# Patient Record
Sex: Male | Born: 1965
Health system: Southern US, Community
[De-identification: ages and names within clinical notes are randomized; demographics above are authoritative.]

## PROBLEM LIST (undated history)

## (undated) DIAGNOSIS — J309 Allergic rhinitis, unspecified: Secondary | ICD-10-CM

## (undated) DIAGNOSIS — E781 Pure hyperglyceridemia: Secondary | ICD-10-CM

## (undated) DIAGNOSIS — E785 Hyperlipidemia, unspecified: Secondary | ICD-10-CM

## (undated) DIAGNOSIS — K219 Gastro-esophageal reflux disease without esophagitis: Secondary | ICD-10-CM

## (undated) DIAGNOSIS — N289 Disorder of kidney and ureter, unspecified: Secondary | ICD-10-CM

## (undated) DIAGNOSIS — R740 Nonspecific elevation of levels of transaminase and lactic acid dehydrogenase [LDH]: Secondary | ICD-10-CM

## (undated) HISTORY — DX: Hyperlipidemia, unspecified: E78.5

## (undated) HISTORY — DX: Gastro-esophageal reflux disease without esophagitis: K21.9

## (undated) HISTORY — DX: Disorder of kidney and ureter, unspecified: N28.9

## (undated) HISTORY — DX: Nonspecific elevation of levels of transaminase and lactic acid dehydrogenase (ldh): R74.0

## (undated) HISTORY — DX: Pure hyperglyceridemia: E78.1

## (undated) HISTORY — DX: Allergic rhinitis, unspecified: J30.9

---

## 1997-11-19 ENCOUNTER — Other Ambulatory Visit: Admission: RE | Admit: 1997-11-19 | Discharge: 1997-11-19 | Payer: Self-pay | Admitting: Obstetrics and Gynecology

## 2007-12-05 ENCOUNTER — Ambulatory Visit: Payer: Self-pay | Admitting: Internal Medicine

## 2007-12-05 DIAGNOSIS — E785 Hyperlipidemia, unspecified: Secondary | ICD-10-CM | POA: Insufficient documentation

## 2007-12-05 DIAGNOSIS — K219 Gastro-esophageal reflux disease without esophagitis: Secondary | ICD-10-CM

## 2007-12-05 DIAGNOSIS — J309 Allergic rhinitis, unspecified: Secondary | ICD-10-CM

## 2007-12-05 HISTORY — DX: Gastro-esophageal reflux disease without esophagitis: K21.9

## 2007-12-05 HISTORY — DX: Hyperlipidemia, unspecified: E78.5

## 2007-12-05 HISTORY — DX: Allergic rhinitis, unspecified: J30.9

## 2010-03-05 ENCOUNTER — Ambulatory Visit: Payer: Self-pay | Admitting: Internal Medicine

## 2010-03-05 LAB — CONVERTED CEMR LAB
Albumin: 4.5 g/dL (ref 3.5–5.2)
Alkaline Phosphatase: 50 units/L (ref 39–117)
Bilirubin Urine: NEGATIVE
Calcium: 9.5 mg/dL (ref 8.4–10.5)
Cholesterol: 189 mg/dL (ref 0–200)
Eosinophils Absolute: 0.1 10*3/uL (ref 0.0–0.7)
Eosinophils Relative: 1.5 % (ref 0.0–5.0)
GFR calc non Af Amer: 96.9 mL/min (ref >60)
Glucose, Bld: 95 mg/dL (ref 70–99)
HCT: 43.1 % (ref 39.0–52.0)
HDL: 43.5 mg/dL (ref >39.00)
Hemoglobin, Urine: NEGATIVE
Lymphs Abs: 1.8 10*3/uL (ref 0.7–4.0)
MCHC: 35.2 g/dL (ref 30.0–36.0)
MCV: 90.9 fL (ref 78.0–100.0)
Monocytes Absolute: 0.4 10*3/uL (ref 0.1–1.0)
Platelets: 197 10*3/uL (ref 150.0–400.0)
RDW: 12.6 % (ref 11.5–14.6)
Sodium: 139 meq/L (ref 135–145)
Total Protein, Urine: NEGATIVE mg/dL
Urine Glucose: NEGATIVE mg/dL
VLDL: 127.4 mg/dL — ABNORMAL HIGH (ref 0.0–40.0)
WBC: 6.2 10*3/uL (ref 4.5–10.5)

## 2010-03-08 ENCOUNTER — Ambulatory Visit: Payer: Self-pay | Admitting: Internal Medicine

## 2010-03-08 ENCOUNTER — Encounter: Payer: Self-pay | Admitting: Internal Medicine

## 2010-03-08 DIAGNOSIS — R7401 Elevation of levels of liver transaminase levels: Secondary | ICD-10-CM

## 2010-03-08 DIAGNOSIS — R740 Nonspecific elevation of levels of transaminase and lactic acid dehydrogenase [LDH]: Secondary | ICD-10-CM

## 2010-03-08 DIAGNOSIS — R7402 Elevation of levels of lactic acid dehydrogenase (LDH): Secondary | ICD-10-CM | POA: Insufficient documentation

## 2010-03-08 HISTORY — DX: Elevation of levels of liver transaminase levels: R74.01

## 2010-03-12 ENCOUNTER — Encounter: Admission: RE | Admit: 2010-03-12 | Discharge: 2010-03-12 | Payer: Self-pay | Admitting: Internal Medicine

## 2010-03-12 ENCOUNTER — Encounter: Payer: Self-pay | Admitting: Internal Medicine

## 2010-03-12 DIAGNOSIS — N289 Disorder of kidney and ureter, unspecified: Secondary | ICD-10-CM

## 2010-03-12 HISTORY — DX: Disorder of kidney and ureter, unspecified: N28.9

## 2010-03-19 ENCOUNTER — Ambulatory Visit (HOSPITAL_COMMUNITY): Admission: RE | Admit: 2010-03-19 | Discharge: 2010-03-19 | Payer: Self-pay | Admitting: Internal Medicine

## 2010-03-23 ENCOUNTER — Telehealth (INDEPENDENT_AMBULATORY_CARE_PROVIDER_SITE_OTHER): Payer: Self-pay | Admitting: *Deleted

## 2010-05-07 ENCOUNTER — Ambulatory Visit: Payer: Self-pay | Admitting: Internal Medicine

## 2010-06-06 LAB — CONVERTED CEMR LAB
ALT: 65 units/L — ABNORMAL HIGH (ref 0–53)
AST: 46 units/L — ABNORMAL HIGH (ref 0–37)
Albumin: 4.2 g/dL (ref 3.5–5.2)
Alkaline Phosphatase: 45 units/L (ref 39–117)
BUN: 11 mg/dL (ref 6–23)
Basophils Relative: 0.3 % (ref 0.0–3.0)
CO2: 30 meq/L (ref 19–32)
Chloride: 103 meq/L (ref 96–112)
Cholesterol: 173 mg/dL (ref 0–200)
Creatinine, Ser: 0.9 mg/dL (ref 0.4–1.5)
Eosinophils Relative: 1.5 % (ref 0.0–5.0)
Glucose, Bld: 97 mg/dL (ref 70–99)
Hemoglobin, Urine: NEGATIVE
LDL Cholesterol: 86 mg/dL (ref 0–99)
Monocytes Relative: 6.5 % (ref 3.0–12.0)
Neutrophils Relative %: 64.6 % (ref 43.0–77.0)
Nitrite: NEGATIVE
Platelets: 217 10*3/uL (ref 150–400)
Potassium: 4.3 meq/L (ref 3.5–5.1)
RBC: 4.67 M/uL (ref 4.22–5.81)
Specific Gravity, Urine: >=1.03 (ref 1.000–1.03)
Total Protein, Urine: NEGATIVE mg/dL
Total Protein: 7 g/dL (ref 6.0–8.3)
Urobilinogen, UA: 0.2 (ref 0.0–1.0)
VLDL: 33 mg/dL (ref 0–40)
WBC: 7.1 10*3/uL (ref 4.5–10.5)

## 2010-06-08 NOTE — Progress Notes (Signed)
  Phone Note Other Incoming   Request: Send information Summary of Call: Records request from [facility] forwarded to [facility].

## 2010-06-08 NOTE — Assessment & Plan Note (Signed)
Summary: CPX/BCBS/#/CD   Vital Signs:  Patient profile:   45 year old male Height:      72.5 inches Weight:      185 pounds BMI:     24.84 O2 Sat:      98 % on Room air Temp:     98.2 degrees F oral Pulse rate:   96 / minute BP sitting:   102 / 70  (left arm) Cuff size:   regular  Vitals Entered By: Zella Ball Ewing CMA Duncan Dull) (March 08, 2010 8:56 AM)  O2 Flow:  Room air  CC: Adult Physical/RE   CC:  Adult Physical/RE.  History of Present Illness: here for wellness, had recent  life insurance labs without fasting with elev TG; Pt denies CP, worsening sob, doe, wheezing, orthopnea, pnd, worsening LE edema, palps, dizziness or syncope  Pt denies new neuro symptoms such as headache, facial or extremity weakness  No fever, wt loss, night sweats, loss of appetite or other constitutional symptoms Pt denies polydipsia, polyuria.  Overall good compliance with meds, trying to follow low chol diet, wt stable, little excercise however - but joining the gym today. Has some occasaional loose stools.  Pt states good ability with ADL's, low fall risk, home safety reviewed and adequate, no significant change in hearing or vision, trying to follow lower chol diet, and occasionally active only with regular excercise.   Denies worsening depressive symptoms, suicidal ideation, or panic.    Preventive Screening-Counseling & Management      Drug Use:  no.    Problems Prior to Update: 1)  Transaminases, Serum, Elevated  (ICD-790.4) 2)  Preventive Health Care  (ICD-V70.0) 3)  Allergic Rhinitis  (ICD-477.9) 4)  Family History Diabetes 1st Degree Relative  (ICD-V18.0) 5)  Family History Breast Cancer 1st Degree Relative <50  (ICD-V16.3) 6)  Hyperlipidemia  (ICD-272.4) 7)  Gerd  (ICD-530.81)  Medications Prior to Update: 1)  None  Current Medications (verified): 1)  Fenofibrate 160 Mg Tabs (Fenofibrate) .Marland Kitchen.. 1po Once Daily 2)  Niacin Cr 500 Mg Cr-Tabs (Niacin) .Marland Kitchen.. 1 By Mouth Once Daily  Allergies  (verified): No Known Drug Allergies  Past History:  Past Medical History: Last updated: 12/05/2007 GERD Hyperlipidemia - hypertriglyceridemia Allergic rhinitis  Past Surgical History: Last updated: 12/05/2007 Denies surgical history  Family History: Last updated: 03/08/2010 Family History Breast cancer - grandmother Family History Diabetes - grandmother Family History Hypertension - father Family History Heart Disease - father grandmother with vascular disease father with PVD, high cholesterol grandfather with throat cancer father with gout  Social History: Last updated: 03/08/2010 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  all otherwise negative per pt -    Physical Exam  General:  Well-developed,well-nourished,in no acute distress; alert,appropriate and cooperative throughout examination Head:   Normocephalic and atraumatic without obvious abnormalities. No apparent alopecia or balding. Eyes:  No corneal or conjunctival inflammation noted. EOMI. Perrla. Ears:  R ear normal and L ear normal.   Nose:  no external deformity and no nasal discharge.   Mouth:  no gingival abnormalities and pharynx pink and moist.   Neck:  supple and no masses.   Lungs:  normal respiratory effort and normal breath sounds.   Heart:  normal rate and regular rhythm.   Abdomen:  Bowel sounds positive,abdomen soft and non-tender without masses, organomegaly or hernias noted. Msk:  No deformity or scoliosis noted of thoracic or lumbar spine.   Extremities:  No clubbing, cyanosis, edema, or deformity noted with normal full range of motion of all joints.   Neurologic:  No cranial nerve deficits noted. Station and gait are normal. Plantar reflexes are down-going bilaterally. DTRs are symmetrical throughout. Sensory, motor and coordinative functions appear intact. Skin:  color normal and no rashes.   Psych:  not anxious appearing and not depressed appearing.     Impression & Recommendations:  Problem # 1:  Preventive Health Care (ICD-V70.0) Overall doing well, age appropriate education and counseling updated, referral for preventive services and immunizations addressed, dietary counseling and smoking status adressed , most recent labs reviewed, ecg reviewed or declined I have personally reviewed and have noted 1.The patient's medical and social history 2.Their use of alcohol, tobacco or illicit drugs 3.Their current medications and supplements 4. Functional ability including ADL's, fall risk, home safety risk, hearing & visual impairment  5.Diet and physical activities 6.Evidence for depression or mood disorders The patients weight, height, BMI  have been recorded in the chart I have made referrals, counseling and provided education to the patient based review of the above  Orders: EKG w/ Interpretation  (93000)  Problem # 2:  TRANSAMINASES, SERUM, ELEVATED (ICD-790.4)  ? fatty liver - for hepatitis profile next draw, and abd u/s  Orders: Radiology Referral (Radiology)  Problem # 3:  HYPERLIPIDEMIA (ICD-272.4)  with elev tg;  treat as above with fenofibrate, and niacin ER, f/u any worsening signs or symptoms ; f/u labs 2 mo  His updated medication list for this problem includes:    Fenofibrate 160 Mg Tabs (Fenofibrate) .Marland Kitchen... 1po once daily    Niacin Cr 500 Mg Cr-tabs (Niacin) .Marland Kitchen... 1 by mouth once daily  Complete Medication List: 1)  Fenofibrate 160 Mg Tabs (Fenofibrate) .Marland Kitchen.. 1po once daily 2)  Niacin Cr 500 Mg Cr-tabs (Niacin) .Marland Kitchen.. 1 by mouth once daily  Patient Instructions: 1)  start the fenofibrate 160 mg now = 1 per day 2)  in one month, add the niacin ER 500 mg - 1 per day 3)  in 2 month return for LAB only : 4)  Hepatic Panel prior to visit, ICD-9: v58.69 5)  Lipid Panel prior to visit, ICD-9: 272.0 6)  Acute hepatitis profile:  790.4 7)  You will be contacted about the referral(s) to: abdomen ultrasound 8)  You had the tetanus shot today 9)  Please schedule a follow-up appointment in 1 year or sooner if needed Prescriptions: NIACIN CR 500 MG CR-TABS (NIACIN) 1 by mouth once daily  #90 x 3   Entered and Authorized by:   Corwin Levins MD   Signed by:   Corwin Levins MD on 03/08/2010  Method used:   Print then Give to Patient   RxID:   [id number] FENOFIBRATE 160 MG TABS (FENOFIBRATE) 1po once daily  #90 x 3   Entered and Authorized by:   Corwin Levins MD   Signed by:   Corwin Levins MD on 03/08/2010   Method used:   Print then Give to Patient   RxID:   [id number]    Orders Added: 1)  EKG w/ Interpretation [93000] 2)  Radiology Referral [Radiology] 3)  Est. Patient 40-64 years [99396]  Appended Document: Immunization Entry      Immunizations Administered:  Tetanus Vaccine:    Vaccine Type: Tdap    Site: left deltoid    Mfr: GlaxoSmithKline     Dose: 0.5 ml    Route: IM    Given by: Zella Ball Ewing CMA (AAMA)    Exp. Date: 02/26/2012    Lot #: [id number]    VIS given: 03/26/08 version given March 08, 2010.

## 2010-06-08 NOTE — Miscellaneous (Signed)
Summary: Orders Update   Clinical Lists Changes  Problems: Added new problem of UNSPECIFIED DISORDER OF KIDNEY AND URETER (ICD-593.9) Orders: Added new Referral order of Radiology Referral (Radiology) - Signed 

## 2011-01-13 ENCOUNTER — Encounter: Payer: Self-pay | Admitting: Internal Medicine

## 2011-01-13 ENCOUNTER — Ambulatory Visit (INDEPENDENT_AMBULATORY_CARE_PROVIDER_SITE_OTHER): Payer: BC Managed Care – PPO | Admitting: Internal Medicine

## 2011-01-13 VITALS — BP 102/62 | HR 101 | Temp 98.4°F | Ht 74.5 in | Wt 177.5 lb

## 2011-01-13 DIAGNOSIS — R059 Cough, unspecified: Secondary | ICD-10-CM

## 2011-01-13 DIAGNOSIS — K219 Gastro-esophageal reflux disease without esophagitis: Secondary | ICD-10-CM

## 2011-01-13 DIAGNOSIS — Z0000 Encounter for general adult medical examination without abnormal findings: Secondary | ICD-10-CM | POA: Insufficient documentation

## 2011-01-13 DIAGNOSIS — R05 Cough: Secondary | ICD-10-CM

## 2011-01-13 DIAGNOSIS — J309 Allergic rhinitis, unspecified: Secondary | ICD-10-CM

## 2011-01-13 MED ORDER — METHYLPREDNISOLONE ACETATE 80 MG/ML IJ SUSP
120.0000 mg | Freq: Once | INTRAMUSCULAR | Status: AC
  Administered 2011-01-13: 120 mg via INTRAMUSCULAR

## 2011-01-13 MED ORDER — FEXOFENADINE HCL 180 MG PO TABS: 180.0000 mg | ORAL_TABLET | Freq: Every day | ORAL | Status: DC

## 2011-01-13 MED ORDER — PREDNISONE 10 MG PO TABS: ORAL_TABLET | ORAL | Status: DC

## 2011-01-13 MED ORDER — FLUTICASONE PROPIONATE 50 MCG/ACT NA SUSP: 2.0000 | Freq: Every day | NASAL | Status: DC

## 2011-01-13 NOTE — Progress Notes (Signed)
  Subjective:    Patient ID: Philip Zimmerman, male    DOB: 06-15-1965, 45 y.o.   MRN: [id number]  HPI  Here with mod to severe sudden onset  9 days ongoing nasal allergy symptoms with clear congestion, itch and sneeze, without fever, pain, ST, or wheezing/sob, but with marked cough, worse in the AM to trying to wake up, somewhat prod with clearish congestion, no blood. Pt denies chest pain, increased sob or doe, wheezing, orthopnea, PND, increased LE swelling, palpitations, dizziness or syncope, except for the above. Pt denies new neurological symptoms such as new headache, or facial or extremity weakness or numbness   Pt denies polydipsia, polyuria.  Denies worsening reflux, dysphagia, abd pain, n/v, bowel change or blood. Past Medical History  Diagnosis Date  . ALLERGIC RHINITIS 12/05/2007  . GERD 12/05/2007  . HYPERLIPIDEMIA 12/05/2007  . TRANSAMINASES, SERUM, ELEVATED 03/08/2010  . Unspecified disorder of kidney and ureter 03/12/2010   No past surgical history on file.  reports that he has been smoking.  He does not have any smokeless tobacco history on file. He reports that he does not drink alcohol or use illicit drugs. family history includes Cancer in his maternal grandfather and maternal grandmother; Diabetes in his maternal grandmother; Gout in his father; Heart disease in his father; Hyperlipidemia in his father; and Hypertension in his father. No Known Allergies No current outpatient prescriptions on file prior to visit.   Review of Systems Review of Systems  Constitutional: Negative for diaphoresis and unexpected weight change.  HENT: Negative for drooling and tinnitus.   Eyes: Negative for photophobia and visual disturbance.  Respiratory: Negative for choking and stridor.   Gastrointestinal: Negative for vomiting and blood in stool.  Genitourinary: Negative for hematuria and decreased urine volume.      Objective:   Physical Exam BP 102/62  Pulse 101  Temp(Src) 98.4 F (36.9  C) (Oral)  Ht 6' 2.5" (1.892 m)  Wt 177 lb 8 oz (80.513 kg)  BMI 22.48 kg/m2  SpO2 94% Physical Exam  VS noted, not ill appearing Constitutional: Pt appears well-developed and well-nourished.  HENT: Head: Normocephalic.  Right Ear: External ear normal.  Left Ear: External ear normal.  Bilat tm's mild erythema.  Sinus nontender.  Pharynx mild erythema Eyes: Conjunctivae and EOM are normal. Pupils are equal, round, and reactive to light.  Neck: Normal range of motion. Neck supple.  Cardiovascular: Normal rate and regular rhythm.   Pulmonary/Chest: Effort normal and breath sounds normal.  Abd:  Soft, NT, non-distended, + BS Neurological: Pt is alert. No cranial nerve deficit.  Skin: Skin is warm. No erythema.  Psychiatric: Pt behavior is normal. Thought content normal.         Assessment & Plan:

## 2011-01-13 NOTE — Assessment & Plan Note (Signed)
Mild to mod seasonal flare, for depomedrol IM, and allegra/flonase asd,  to f/u any worsening symptoms or concerns

## 2011-01-13 NOTE — Assessment & Plan Note (Signed)
Likely related to post nasal gtt, but as he is also smoker, will check cxr

## 2011-01-13 NOTE — Patient Instructions (Addendum)
You had the steroid shot today Take all new medications as prescribed Please go to XRAY in the Basement for the x-ray test Please call the phone number [phone] (the [employer] System) for results of testing in 2-3 days;  When calling, simply dial the number, and when prompted enter the MRN number above (the Medical Record Number) and the # key, then the message should start. Please stop smoking Please return in 3 mo with Lab testing done 3-5 days before

## 2011-01-13 NOTE — Assessment & Plan Note (Signed)
stable overall by hx and exam, most recent data reviewed with pt, and pt to continue medical treatment as before  Lab Results  Component Value Date   WBC 6.2 03/05/2010   HGB 15.2 03/05/2010   HCT 43.1 03/05/2010   PLT 197.0 03/05/2010   CHOL 189 03/05/2010   TRIG 637.0 Triglyceride is over 400; calculations on Lipids are invalid. mg/dL* 96/08/5407   HDL 81.19 03/05/2010   LDLDIRECT 72.6 03/05/2010   ALT 67* 03/05/2010   AST 53* 03/05/2010   NA 139 03/05/2010   K 4.5 03/05/2010   CL 101 03/05/2010   CREATININE 0.9 03/05/2010   BUN 14 03/05/2010   CO2 32 03/05/2010   TSH 0.98 03/05/2010   PSA 0.58 03/05/2010

## 2011-04-15 ENCOUNTER — Ambulatory Visit: Payer: BC Managed Care – PPO | Admitting: Internal Medicine

## 2011-04-15 DIAGNOSIS — Z0289 Encounter for other administrative examinations: Secondary | ICD-10-CM

## 2011-05-19 ENCOUNTER — Other Ambulatory Visit: Payer: Self-pay | Admitting: Internal Medicine

## 2011-05-19 ENCOUNTER — Telehealth: Payer: Self-pay

## 2011-05-19 ENCOUNTER — Other Ambulatory Visit (INDEPENDENT_AMBULATORY_CARE_PROVIDER_SITE_OTHER): Payer: BC Managed Care – PPO

## 2011-05-19 DIAGNOSIS — Z1289 Encounter for screening for malignant neoplasm of other sites: Secondary | ICD-10-CM

## 2011-05-19 DIAGNOSIS — Z0000 Encounter for general adult medical examination without abnormal findings: Secondary | ICD-10-CM

## 2011-05-19 LAB — URINALYSIS, ROUTINE W REFLEX MICROSCOPIC
Bilirubin Urine: NEGATIVE
Leukocytes, UA: NEGATIVE
Nitrite: NEGATIVE
Urobilinogen, UA: 0.2 (ref 0.0–1.0)
pH: 5.5 (ref 5.0–8.0)

## 2011-05-19 LAB — HEPATIC FUNCTION PANEL
ALT: 52 U/L (ref 0–53)
Albumin: 4.4 g/dL (ref 3.5–5.2)
Total Bilirubin: 0.4 mg/dL (ref 0.3–1.2)

## 2011-05-19 LAB — LIPID PANEL
Total CHOL/HDL Ratio: 5
VLDL: 235.8 mg/dL — ABNORMAL HIGH (ref 0.0–40.0)

## 2011-05-19 LAB — BASIC METABOLIC PANEL
BUN: 15 mg/dL (ref 6–23)
Chloride: 104 mEq/L (ref 96–112)
Creatinine, Ser: 1 mg/dL (ref 0.4–1.5)
GFR: 103.37 mL/min (ref >60.00)

## 2011-05-19 LAB — CBC WITH DIFFERENTIAL/PLATELET
Basophils Relative: 0.2 % (ref 0.0–3.0)
Eosinophils Absolute: 0.1 10*3/uL (ref 0.0–0.7)
Hemoglobin: 14.1 g/dL (ref 13.0–17.0)
MCHC: 35.2 g/dL (ref 30.0–36.0)
MCV: 91.2 fl (ref 78.0–100.0)
Monocytes Absolute: 0.4 10*3/uL (ref 0.1–1.0)
Neutro Abs: 4.4 10*3/uL (ref 1.4–7.7)
RBC: 4.38 Mil/uL (ref 4.22–5.81)

## 2011-05-19 LAB — TSH: TSH: 1.35 u[IU]/mL (ref 0.35–5.50)

## 2011-05-19 LAB — PSA: PSA: 0.51 ng/mL (ref 0.10–4.00)

## 2011-05-19 LAB — LDL CHOLESTEROL, DIRECT: Direct LDL: 66.2 mg/dL

## 2011-05-19 NOTE — Telephone Encounter (Signed)
Put order in for physical labs. 

## 2011-05-23 ENCOUNTER — Encounter: Payer: Self-pay | Admitting: Internal Medicine

## 2011-05-23 ENCOUNTER — Ambulatory Visit (INDEPENDENT_AMBULATORY_CARE_PROVIDER_SITE_OTHER): Payer: BC Managed Care – PPO | Admitting: Internal Medicine

## 2011-05-23 VITALS — BP 122/88 | HR 83 | Temp 97.9°F | Ht 74.0 in | Wt 187.2 lb

## 2011-05-23 DIAGNOSIS — E781 Pure hyperglyceridemia: Secondary | ICD-10-CM

## 2011-05-23 DIAGNOSIS — Z0000 Encounter for general adult medical examination without abnormal findings: Secondary | ICD-10-CM

## 2011-05-23 HISTORY — DX: Pure hyperglyceridemia: E78.1

## 2011-05-23 MED ORDER — FENOFIBRATE 160 MG PO TABS: 160.0000 mg | ORAL_TABLET | Freq: Every day | ORAL | Status: DC

## 2011-05-23 NOTE — Assessment & Plan Note (Signed)
Severe related to high fat diet, d/w pt - for lower fat/chol diet, start fenofibrate 160 qd

## 2011-05-23 NOTE — Patient Instructions (Addendum)
Take all new medications as prescribed - fenofibrate (sent to the pharmacy) Continue all other medications as before Please have the pharmacy call if you need further refills Please follow strict low fat, low cholesterol diet Please return in 1 year for your yearly visit, or sooner if needed, with Lab testing done 3-5 days before

## 2011-05-23 NOTE — Progress Notes (Signed)
Subjective:    Patient ID: Philip Zimmerman, male    DOB: 01-14-1966, 46 y.o.   MRN: [id number]  HPI  Here for wellness and f/u;  Overall doing ok;  Pt denies CP, worsening SOB, DOE, wheezing, orthopnea, PND, worsening LE edema, palpitations, dizziness or syncope.  Pt denies neurological change such as new Headache, facial or extremity weakness.  Pt denies polydipsia, polyuria, or low sugar symptoms. Pt states overall good compliance with treatment and medications, good tolerability, and trying to follow lower cholesterol diet.  Pt denies worsening depressive symptoms, suicidal ideation or panic. No fever, wt loss, night sweats, loss of appetite, or other constitutional symptoms.  Pt states good ability with ADL's, low fall risk, home safety reviewed and adequate, no significant changes in hearing or vision, and has been trying to be more active in the past yr with gym 5 times per wk.  Admits to poor diet with high fat such as mult cheeseburger per meal Past Medical History  Diagnosis Date  . ALLERGIC RHINITIS 12/05/2007  . GERD 12/05/2007  . HYPERLIPIDEMIA 12/05/2007  . TRANSAMINASES, SERUM, ELEVATED 03/08/2010  . Unspecified disorder of kidney and ureter 03/12/2010   No past surgical history on file.  reports that he has been smoking.  He does not have any smokeless tobacco history on file. He reports that he does not drink alcohol or use illicit drugs. family history includes Cancer in his maternal grandfather and maternal grandmother; Diabetes in his maternal grandmother; Gout in his father; Heart disease in his father; Hyperlipidemia in his father; and Hypertension in his father. No Known Allergies Current Outpatient Prescriptions on File Prior to Visit  Medication Sig Dispense Refill  . fexofenadine (ALLEGRA) 180 MG tablet Take 1 tablet (180 mg total) by mouth daily.  30 tablet  2  . fluticasone (FLONASE) 50 MCG/ACT nasal spray Place 2 sprays into the nose daily.  16 g  2   Review of  Systems Review of Systems  Constitutional: Negative for diaphoresis, activity change, appetite change and unexpected weight change.  HENT: Negative for hearing loss, ear pain, facial swelling, mouth sores and neck stiffness.   Eyes: Negative for pain, redness and visual disturbance.  Respiratory: Negative for shortness of breath and wheezing.   Cardiovascular: Negative for chest pain and palpitations.  Gastrointestinal: Negative for diarrhea, blood in stool, abdominal distention and rectal pain.  Genitourinary: Negative for hematuria, flank pain and decreased urine volume.  Musculoskeletal: Negative for myalgias and joint swelling.  Skin: Negative for color change and wound.  Neurological: Negative for syncope and numbness.  Hematological: Negative for adenopathy.  Psychiatric/Behavioral: Negative for hallucinations, self-injury, decreased concentration and agitation.      Objective:   Physical Exam BP 122/88  Pulse 83  Temp(Src) 97.9 F (36.6 C) (Oral)  Ht 6\' 2"  (1.88 m)  Wt 187 lb 4 oz (84.936 kg)  BMI 24.04 kg/m2  SpO2 98% Physical Exam  VS noted Constitutional: Pt is oriented to person, place, and time. Appears well-developed and well-nourished.  HENT:  Head: Normocephalic and atraumatic.  Right Ear: External ear normal.  Left Ear: External ear normal.  Nose: Nose normal.  Mouth/Throat: Oropharynx is clear and moist.  Eyes: Conjunctivae and EOM are normal. Pupils are equal, round, and reactive to light.  Neck: Normal range of motion. Neck supple. No JVD present. No tracheal deviation present.  Cardiovascular: Normal rate, regular rhythm, normal heart sounds and intact distal pulses.   Pulmonary/Chest: Effort normal and breath sounds normal.  Abdominal: Soft. Bowel sounds are normal. There is no tenderness.  Musculoskeletal: Normal range of motion. Exhibits no edema.  Lymphadenopathy:  Has no cervical adenopathy.  Neurological: Pt is alert and oriented to person, place,  and time. Pt has normal reflexes. No cranial nerve deficit.  Skin: Skin is warm and dry. No rash noted.  Psychiatric:  Has  normal mood and affect. Behavior is normal.     Assessment & Plan:

## 2011-05-23 NOTE — Assessment & Plan Note (Signed)

## 2013-01-16 ENCOUNTER — Telehealth: Payer: Self-pay

## 2013-01-16 DIAGNOSIS — Z0000 Encounter for general adult medical examination without abnormal findings: Secondary | ICD-10-CM

## 2013-01-16 NOTE — Telephone Encounter (Signed)
cpx labs entered  

## 2013-03-05 ENCOUNTER — Ambulatory Visit (INDEPENDENT_AMBULATORY_CARE_PROVIDER_SITE_OTHER): Payer: BC Managed Care – PPO

## 2013-03-05 DIAGNOSIS — Z0000 Encounter for general adult medical examination without abnormal findings: Secondary | ICD-10-CM

## 2013-03-05 LAB — LIPID PANEL
HDL: 32.3 mg/dL — ABNORMAL LOW (ref >39.00)
Total CHOL/HDL Ratio: 7
VLDL: 325.4 mg/dL — ABNORMAL HIGH (ref 0.0–40.0)

## 2013-03-05 LAB — CBC WITH DIFFERENTIAL/PLATELET
Basophils Relative: 0.4 % (ref 0.0–3.0)
Eosinophils Relative: 1.5 % (ref 0.0–5.0)
HCT: 40.9 % (ref 39.0–52.0)
Lymphs Abs: 2.3 10*3/uL (ref 0.7–4.0)
MCV: 89.5 fl (ref 78.0–100.0)
Monocytes Absolute: 0.3 10*3/uL (ref 0.1–1.0)
Neutrophils Relative %: 63.4 % (ref 43.0–77.0)
RBC: 4.57 Mil/uL (ref 4.22–5.81)
WBC: 7.6 10*3/uL (ref 4.5–10.5)

## 2013-03-05 LAB — BASIC METABOLIC PANEL
BUN: 14 mg/dL (ref 6–23)
GFR: 110.04 mL/min (ref >60.00)
Glucose, Bld: 109 mg/dL — ABNORMAL HIGH (ref 70–99)
Potassium: 4.2 mEq/L (ref 3.5–5.1)

## 2013-03-05 LAB — LDL CHOLESTEROL, DIRECT: Direct LDL: 48.4 mg/dL

## 2013-03-05 LAB — HEPATIC FUNCTION PANEL
AST: 40 U/L — ABNORMAL HIGH (ref 0–37)
Albumin: 4.3 g/dL (ref 3.5–5.2)
Total Protein: 6.6 g/dL (ref 6.0–8.3)

## 2013-03-05 LAB — TSH: TSH: 1.31 u[IU]/mL (ref 0.35–5.50)

## 2013-03-06 LAB — URINALYSIS, ROUTINE W REFLEX MICROSCOPIC
Bilirubin Urine: NEGATIVE
Hgb urine dipstick: NEGATIVE
Ketones, ur: NEGATIVE
Leukocytes, UA: NEGATIVE
RBC / HPF: NONE SEEN (ref 0–?)
Specific Gravity, Urine: 1.025 (ref 1.000–1.030)
Urobilinogen, UA: 0.2 (ref 0.0–1.0)

## 2013-03-07 ENCOUNTER — Encounter: Payer: Self-pay | Admitting: Internal Medicine

## 2013-03-07 ENCOUNTER — Ambulatory Visit (INDEPENDENT_AMBULATORY_CARE_PROVIDER_SITE_OTHER): Payer: BC Managed Care – PPO | Admitting: Internal Medicine

## 2013-03-07 VITALS — BP 112/70 | HR 85 | Temp 98.4°F | Ht 74.5 in | Wt 183.0 lb

## 2013-03-07 DIAGNOSIS — Z0000 Encounter for general adult medical examination without abnormal findings: Secondary | ICD-10-CM

## 2013-03-07 DIAGNOSIS — E781 Pure hyperglyceridemia: Secondary | ICD-10-CM

## 2013-03-07 MED ORDER — FENOFIBRATE 160 MG PO TABS: 160.0000 mg | ORAL_TABLET | Freq: Every day | ORAL | Status: DC

## 2013-03-07 NOTE — Progress Notes (Signed)
Subjective:    Patient ID: Philip Zimmerman, male    DOB: [birth year], 47 y.o.   MRN: [id number]  HPI  Here for wellness and f/u;  Overall doing ok;  Pt denies CP, worsening SOB, DOE, wheezing, orthopnea, PND, worsening LE edema, palpitations, dizziness or syncope.  Pt denies neurological change such as new headache, facial or extremity weakness.  Pt denies polydipsia, polyuria, or low sugar symptoms. Pt states overall good compliance with treatment and medications, good tolerability, and has been trying to follow lower cholesterol diet.  Pt denies worsening depressive symptoms, suicidal ideation or panic. No fever, night sweats, wt loss, loss of appetite, or other constitutional symptoms.  Pt states good ability with ADL's, has low fall risk, home safety reviewed and adequate, no other significant changes in hearing or vision, and only occasionally active with exercise.  Allergy symptoms mild worse this fall, but controlled.  Denies worsening reflux, abd pain, dysphagia, n/v, bowel change or blood.  No other acute concerns. Declines flu shot today Past Medical History  Diagnosis Date  . ALLERGIC RHINITIS 12/05/2007  . GERD 12/05/2007  . HYPERLIPIDEMIA 12/05/2007  . TRANSAMINASES, SERUM, ELEVATED 03/08/2010  . Unspecified disorder of kidney and ureter 03/12/2010  . Hypertriglyceridemia 05/23/2011   No past surgical history on file.  reports that he has been smoking.  He does not have any smokeless tobacco history on file. He reports that he does not drink alcohol or use illicit drugs. family history includes Cancer in his maternal grandfather and maternal grandmother; Diabetes in his maternal grandmother; Gout in his father; Heart disease in his father; Hyperlipidemia in his father; Hypertension in his father. No Known Allergies No current outpatient prescriptions on file prior to visit.   No current facility-administered medications on file prior to visit.   Review of Systems Constitutional:  Negative for diaphoresis, activity change, appetite change or unexpected weight change.  HENT: Negative for hearing loss, ear pain, facial swelling, mouth sores and neck stiffness.   Eyes: Negative for pain, redness and visual disturbance.  Respiratory: Negative for shortness of breath and wheezing.   Cardiovascular: Negative for chest pain and palpitations.  Gastrointestinal: Negative for diarrhea, blood in stool, abdominal distention or other pain Genitourinary: Negative for hematuria, flank pain or change in urine volume.  Musculoskeletal: Negative for myalgias and joint swelling.  Skin: Negative for color change and wound.  Neurological: Negative for syncope and numbness. other than noted Hematological: Negative for adenopathy.  Psychiatric/Behavioral: Negative for hallucinations, self-injury, decreased concentration and agitation.      Objective:   Physical Exam BP 112/70  Pulse 85  Temp(Src) 98.4 F (36.9 C) (Oral)  Ht 6' 2.5" (1.892 m)  Wt 183 lb (83.008 kg)  BMI 23.19 kg/m2  SpO2 93% VS noted,  Constitutional: Pt is oriented to person, place, and time. Appears well-developed and well-nourished.  Head: Normocephalic and atraumatic.  Right Ear: External ear normal.  Left Ear: External ear normal.  Nose: Nose normal.  Mouth/Throat: Oropharynx is clear and moist.  Eyes: Conjunctivae and EOM are normal. Pupils are equal, round, and reactive to light.  Neck: Normal range of motion. Neck supple. No JVD present. No tracheal deviation present.  Cardiovascular: Normal rate, regular rhythm, normal heart sounds and intact distal pulses.   Pulmonary/Chest: Effort normal and breath sounds normal.  Abdominal: Soft. Bowel sounds are normal. There is no tenderness. No HSM  Musculoskeletal: Normal range of motion. Exhibits no edema.  Lymphadenopathy:  Has no cervical adenopathy.  Neurological: Pt  is alert and oriented to person, place, and time. Pt has normal reflexes. No cranial nerve  deficit.  Skin: Skin is warm and dry. No rash noted.  Psychiatric:  Has  normal mood and affect. Behavior is normal.     Assessment & Plan:

## 2013-03-07 NOTE — Patient Instructions (Signed)
Please take all new medication as prescribed - the fenofibrate Please continue all other medications as before Please have the pharmacy call with any other refills you may need. Please continue your efforts at being more active, low cholesterol diet, and weight control. You are otherwise up to date with prevention measures today.  Please return in 6-8 wks for LAB only for the follow up lipid panel on the new medication  Please return in 1 year for your yearly visit, or sooner if needed, with Lab testing done 3-5 days before

## 2013-03-07 NOTE — Assessment & Plan Note (Signed)

## 2013-03-10 NOTE — Assessment & Plan Note (Signed)
Severe, for fenofibrate 160, f/u lab in 6 wks, for lower fat/lower chol diet

## 2013-08-21 ENCOUNTER — Other Ambulatory Visit: Payer: Self-pay | Admitting: Internal Medicine

## 2013-08-21 ENCOUNTER — Other Ambulatory Visit (INDEPENDENT_AMBULATORY_CARE_PROVIDER_SITE_OTHER): Payer: BC Managed Care – PPO

## 2013-08-21 ENCOUNTER — Encounter: Payer: Self-pay | Admitting: Internal Medicine

## 2013-08-21 DIAGNOSIS — E781 Pure hyperglyceridemia: Secondary | ICD-10-CM

## 2013-08-21 LAB — LIPID PANEL
CHOL/HDL RATIO: 4
CHOLESTEROL: 178 mg/dL (ref 0–200)
HDL: 50 mg/dL (ref >39.00)
LDL Cholesterol: 67 mg/dL (ref 0–99)
Triglycerides: 304 mg/dL — ABNORMAL HIGH (ref 0.0–149.0)
VLDL: 60.8 mg/dL — ABNORMAL HIGH (ref 0.0–40.0)

## 2013-08-26 ENCOUNTER — Other Ambulatory Visit: Payer: Self-pay | Admitting: *Deleted

## 2013-08-26 MED ORDER — FENOFIBRATE 160 MG PO TABS: 160.0000 mg | ORAL_TABLET | Freq: Every day | ORAL | Status: DC

## 2014-03-18 ENCOUNTER — Ambulatory Visit (INDEPENDENT_AMBULATORY_CARE_PROVIDER_SITE_OTHER)
Admission: RE | Admit: 2014-03-18 | Discharge: 2014-03-18 | Disposition: A | Discharge status: Home / Self Care | Payer: BC Managed Care – PPO | Source: Ambulatory Visit | Attending: Internal Medicine | Admitting: Internal Medicine

## 2014-03-18 ENCOUNTER — Encounter: Payer: Self-pay | Admitting: Internal Medicine

## 2014-03-18 ENCOUNTER — Other Ambulatory Visit (INDEPENDENT_AMBULATORY_CARE_PROVIDER_SITE_OTHER): Payer: BC Managed Care – PPO

## 2014-03-18 ENCOUNTER — Ambulatory Visit (INDEPENDENT_AMBULATORY_CARE_PROVIDER_SITE_OTHER): Payer: BC Managed Care – PPO | Admitting: Internal Medicine

## 2014-03-18 VITALS — BP 110/82 | HR 90 | Temp 98.4°F | Wt 186.4 lb

## 2014-03-18 DIAGNOSIS — R109 Unspecified abdominal pain: Secondary | ICD-10-CM | POA: Insufficient documentation

## 2014-03-18 DIAGNOSIS — R1030 Lower abdominal pain, unspecified: Secondary | ICD-10-CM

## 2014-03-18 DIAGNOSIS — E781 Pure hyperglyceridemia: Secondary | ICD-10-CM

## 2014-03-18 DIAGNOSIS — K219 Gastro-esophageal reflux disease without esophagitis: Secondary | ICD-10-CM

## 2014-03-18 LAB — URINALYSIS, ROUTINE W REFLEX MICROSCOPIC
Bilirubin Urine: NEGATIVE
HGB URINE DIPSTICK: NEGATIVE
Ketones, ur: NEGATIVE
LEUKOCYTES UA: NEGATIVE
Nitrite: NEGATIVE
RBC / HPF: NONE SEEN (ref 0–?)
Specific Gravity, Urine: 1.02 (ref 1.000–1.030)
Total Protein, Urine: NEGATIVE
UROBILINOGEN UA: 0.2 (ref 0.0–1.0)
Urine Glucose: 250 — AB
pH: 5.5 (ref 5.0–8.0)

## 2014-03-18 MED ORDER — TIZANIDINE HCL 4 MG PO TABS: 4.0000 mg | ORAL_TABLET | Freq: Four times a day (QID) | ORAL | Status: DC | PRN

## 2014-03-18 NOTE — Patient Instructions (Signed)
Please take all new medication as prescribed - the muscle relaxer as prescribed (as needed)  Please continue all other medications as before  Please have the pharmacy call with any other refills you may need.  Please continue your efforts at being more active, low cholesterol diet, and weight control.  Please keep your appointments with your specialists as you may have planned  Please go to the LAB in the Basement (turn left off the elevator) for the tests to be done today  Please go to the XRAY Department in the Basement (go straight as you get off the elevator) for the x-ray testing  You will be contacted by phone if any changes need to be made immediately.  Otherwise, you will receive a letter about your results with an explanation, but please check with MyChart first.  Please remember to sign up for MyChart if you have not done so, as this will be important to you in the future with finding out test results, communicating by private email, and scheduling acute appointments online when needed.  We'll see you back at your physical as planned

## 2014-03-18 NOTE — Progress Notes (Signed)
Pre visit review using our clinic review tool, if applicable. No additional management support is needed unless otherwise documented below in the visit note. 

## 2014-03-18 NOTE — Assessment & Plan Note (Signed)
stable overall by history and exam, recent data reviewed with pt, and pt to continue medical treatment as before,  to f/u any worsening symptoms or concerns Lab Results  Component Value Date   WBC 7.6 03/05/2013   HGB 14.5 03/05/2013   HCT 40.9 03/05/2013   PLT 190.0 03/05/2013   GLUCOSE 109* 03/05/2013   CHOL 178 08/21/2013   TRIG 304.0* 08/21/2013   HDL 50.00 08/21/2013   LDLDIRECT 48.4 03/05/2013   LDLCALC 67 08/21/2013   ALT 39 03/05/2013   AST 40* 03/05/2013   NA 139 03/05/2013   K 4.2 03/05/2013   CL 105 03/05/2013   CREATININE 1.0 03/05/2013   BUN 14 03/05/2013   CO2 26 03/05/2013   TSH 1.31 03/05/2013   PSA 0.53 03/05/2013

## 2014-03-18 NOTE — Assessment & Plan Note (Signed)
Currently not taking med, prefers to hold for now, has lipids with next visit next mo, working very hard on low fat diet

## 2014-03-18 NOTE — Assessment & Plan Note (Signed)
?   MSK - most likely it seems, but also for UA, and KUB to r/o low sympt renal stone,  to f/u any worsening symptoms or concerns

## 2014-03-18 NOTE — Progress Notes (Signed)
Subjective:    Patient ID: Philip Zimmerman, male    DOB: 1965-05-17, 48 y.o.   MRN: [id number]  HPI  Here with LLQ abd /side pain that seems to radiate towards the epigastrium and LUQ, ongoing x 3 wks, constant, wax and wanes, mild now, o/w 4-5/10 at worst.  Drinking more water no help, bending and twisting no help, not gait limiting. No hx of renal stones, Denies urinary symptoms such as dysuria, frequency, urgency, flank pain, hematuria or n/v, fever, chills.  Denies worsening reflux, abd pain, dysphagia, n/v, bowel change or blood; not sure but BM might help the pain better.  Today incidentally pain is better than in last 3 wks. No recent new exercise or heavy lifting.  Concerned as his father had PAD with lef groin pain then surgury for infected bypass graft.   Pt denies fever, wt loss, night sweats, loss of appetite, or other constitutional symptoms, in fact may have gained a few lbs.   Advil x 1 helped a few days ago. Did play golft last oct 16, did not think at the time he strained anything, often plays at least weekly.  Past Medical History  Diagnosis Date  . ALLERGIC RHINITIS 12/05/2007  . GERD 12/05/2007  . HYPERLIPIDEMIA 12/05/2007  . TRANSAMINASES, SERUM, ELEVATED 03/08/2010  . Unspecified disorder of kidney and ureter 03/12/2010  . Hypertriglyceridemia 05/23/2011   No past surgical history on file.  reports that he has been smoking.  He does not have any smokeless tobacco history on file. He reports that he does not drink alcohol or use illicit drugs. family history includes Cancer in his maternal grandfather and maternal grandmother; Diabetes in his maternal grandmother; Gout in his father; Heart disease in his father; Hyperlipidemia in his father; Hypertension in his father. No Known Allergies Current Outpatient Prescriptions on File Prior to Visit  Medication Sig Dispense Refill  . fenofibrate 160 MG tablet Take 1 tablet (160 mg total) by mouth daily. 90 tablet 3   No current  facility-administered medications on file prior to visit.   Review of Systems  Constitutional: Negative for unusual diaphoresis or other sweats  HENT: Negative for ringing in ear Eyes: Negative for double vision or worsening visual disturbance.  Respiratory: Negative for choking and stridor.   Gastrointestinal: Negative for vomiting or other signifcant bowel change Genitourinary: Negative for hematuria or decreased urine volume.  Musculoskeletal: Negative for other MSK pain or swelling Skin: Negative for color change and worsening wound.  Neurological: Negative for tremors and numbness other than noted  Psychiatric/Behavioral: Negative for decreased concentration or agitation other than above  '    Objective:   Physical Exam BP 110/82 mmHg  Pulse 90  Temp(Src) 98.4 F (36.9 C) (Oral)  Wt 186 lb 6 oz (84.539 kg)  SpO2 96% VS noted,  Constitutional: Pt appears well-developed, well-nourished.  HENT: Head: NCAT.  Right Ear: External ear normal.  Left Ear: External ear normal.  Eyes: . Pupils are equal, round, and reactive to light. Conjunctivae and EOM are normal Neck: Normal range of motion. Neck supple.  Cardiovascular: Normal rate and regular rhythm.   Pulmonary/Chest: Effort normal and breath sounds normal.  Abd:  Soft, NT, ND, + BS -  Benign except for tender area left lower rib cage mid axillary line approx t10-12 without sweling or erythema Neurological: Pt is alert. Not confused , motor grossly intact Skin: Skin is warm. No rash Psychiatric: Pt behavior is normal. No agitation.     Assessment &  Plan:

## 2014-03-26 ENCOUNTER — Encounter: Payer: Self-pay | Admitting: Internal Medicine

## 2014-03-26 ENCOUNTER — Ambulatory Visit (INDEPENDENT_AMBULATORY_CARE_PROVIDER_SITE_OTHER): Payer: BC Managed Care – PPO | Admitting: Internal Medicine

## 2014-03-26 VITALS — BP 100/70 | HR 101 | Temp 98.5°F | Wt 184.1 lb

## 2014-03-26 DIAGNOSIS — B029 Zoster without complications: Secondary | ICD-10-CM

## 2014-03-26 MED ORDER — FAMCICLOVIR 500 MG PO TABS: 500.0000 mg | ORAL_TABLET | Freq: Three times a day (TID) | ORAL | Status: DC

## 2014-03-26 MED ORDER — GABAPENTIN 100 MG PO CAPS: ORAL_CAPSULE | ORAL | Status: DC

## 2014-03-26 NOTE — Patient Instructions (Signed)
After having herpes zoster or shingles;  a natural immunity against recurrent shingles should be present for approximately 24 months. After that time shingles immunization would be indicated.  

## 2014-03-26 NOTE — Progress Notes (Signed)
   Subjective:    Patient ID: Philip Zimmerman, male    DOB: 1965/09/29, 48 y.o.   MRN: [id number]  HPI  He believes that symptoms began 03/19/14 as a sensation of a "bite" in the right midthoracic spine area. As of 11/15 there is a punctate rash which progressed to be pruritic over several days.  As of today he has noted extensive patches anteriorly and lateral to the initial lesion.  He has no other constitutional symptoms.  He has some sneezing which is unrelated.  Review of Systems   He denies fever, chills, sweats, weight loss  He has no nausea, vomiting, or diarrhea       Objective:   Physical Exam   Positive or pertinent findings include classic herpes zoster rash in the T4 dermatome on the left  General appearance :adequately nourished; in no distress. Eyes: No conjunctival inflammation or scleral icterus is present. Oral exam: Dental hygiene is good. Lips and gums are healthy appearing.There is no oropharyngeal erythema or exudate noted.  Heart:  Normal rate and regular rhythm. S1 and S2 normal without gallop, murmur, click, rub or other extra sounds   Lungs:Chest clear to auscultation; no wheezes, rhonchi,rales ,or rubs present.No increased work of breathing.  Vascular : all pulses equal ; no bruits present. Skin:Warm & dry. No tenting Lymphatic: No lymphadenopathy is noted about the head, neck, axilla            Assessment & Plan:  #1 left thoracic T4 herpes zoster  Plan: Pathophysiology was discussed. Restrictions & precautions were also discussed.  See orders and after visit summary

## 2014-03-26 NOTE — Progress Notes (Signed)
Pre visit review using our clinic review tool, if applicable. No additional management support is needed unless otherwise documented below in the visit note. 

## 2014-03-27 ENCOUNTER — Telehealth: Payer: Self-pay | Admitting: Internal Medicine

## 2014-03-27 NOTE — Telephone Encounter (Signed)
emmi emailed °

## 2014-08-04 ENCOUNTER — Ambulatory Visit (INDEPENDENT_AMBULATORY_CARE_PROVIDER_SITE_OTHER)
Admission: RE | Admit: 2014-08-04 | Discharge: 2014-08-04 | Disposition: A | Discharge status: Home / Self Care | Payer: BLUE CROSS/BLUE SHIELD | Source: Ambulatory Visit | Attending: Internal Medicine | Admitting: Internal Medicine

## 2014-08-04 ENCOUNTER — Encounter: Payer: Self-pay | Admitting: Internal Medicine

## 2014-08-04 ENCOUNTER — Ambulatory Visit (INDEPENDENT_AMBULATORY_CARE_PROVIDER_SITE_OTHER): Payer: BLUE CROSS/BLUE SHIELD | Admitting: Internal Medicine

## 2014-08-04 VITALS — BP 114/80 | HR 100 | Temp 98.3°F | Resp 14 | Ht 74.0 in | Wt 184.8 lb

## 2014-08-04 DIAGNOSIS — M5410 Radiculopathy, site unspecified: Secondary | ICD-10-CM

## 2014-08-04 DIAGNOSIS — M5416 Radiculopathy, lumbar region: Secondary | ICD-10-CM

## 2014-08-04 MED ORDER — TRAMADOL HCL 50 MG PO TABS: 50.0000 mg | ORAL_TABLET | Freq: Four times a day (QID) | ORAL | Status: DC | PRN

## 2014-08-04 MED ORDER — TIZANIDINE HCL 4 MG PO TABS: 4.0000 mg | ORAL_TABLET | Freq: Three times a day (TID) | ORAL | Status: DC | PRN
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
## 2014-08-04 NOTE — Progress Notes (Signed)
Pre visit review using our clinic review tool, if applicable. No additional management support is needed unless otherwise documented below in the visit note. 

## 2014-08-04 NOTE — Patient Instructions (Signed)
The best exercises for the low back include freestyle swimming, stretch aerobics, and yoga.Cybex & Nautilus machines rather than dead weights are better for the back.   Your next office appointment will be determined based upon review of your pending  xrays  Those instructions will be transmitted to you by  by mail for your records.  Critical results will be called.   Followup as needed for any active or acute issue. Please report any significant change in your symptoms.

## 2014-11-21 ENCOUNTER — Other Ambulatory Visit (INDEPENDENT_AMBULATORY_CARE_PROVIDER_SITE_OTHER): Payer: BLUE CROSS/BLUE SHIELD

## 2014-11-21 ENCOUNTER — Telehealth: Payer: Self-pay | Admitting: Internal Medicine

## 2014-11-21 ENCOUNTER — Ambulatory Visit (INDEPENDENT_AMBULATORY_CARE_PROVIDER_SITE_OTHER): Payer: BLUE CROSS/BLUE SHIELD | Admitting: Internal Medicine

## 2014-11-21 VITALS — BP 118/78 | HR 110 | Temp 98.3°F | Resp 16 | Ht 74.0 in | Wt 177.0 lb

## 2014-11-21 DIAGNOSIS — R000 Tachycardia, unspecified: Secondary | ICD-10-CM | POA: Diagnosis not present

## 2014-11-21 DIAGNOSIS — K047 Periapical abscess without sinus: Secondary | ICD-10-CM

## 2014-11-21 LAB — CBC WITH DIFFERENTIAL/PLATELET
Basophils Absolute: 0 10*3/uL (ref 0.0–0.1)
Basophils Relative: 0.1 % (ref 0.0–3.0)
Eosinophils Absolute: 0.2 10*3/uL (ref 0.0–0.7)
Eosinophils Relative: 1.2 % (ref 0.0–5.0)
HCT: 44.4 % (ref 39.0–52.0)
Hemoglobin: 15.1 g/dL (ref 13.0–17.0)
LYMPHS ABS: 2 10*3/uL (ref 0.7–4.0)
LYMPHS PCT: 15.9 % (ref 12.0–46.0)
MCHC: 33.9 g/dL (ref 30.0–36.0)
MCV: 92.1 fl (ref 78.0–100.0)
MONO ABS: 0.8 10*3/uL (ref 0.1–1.0)
Monocytes Relative: 6.7 % (ref 3.0–12.0)
Neutro Abs: 9.5 10*3/uL — ABNORMAL HIGH (ref 1.4–7.7)
Neutrophils Relative %: 76.1 % (ref 43.0–77.0)
Platelets: 210 10*3/uL (ref 150.0–400.0)
RBC: 4.82 Mil/uL (ref 4.22–5.81)
RDW: 13.4 % (ref 11.5–15.5)
WBC: 12.4 10*3/uL — AB (ref 4.0–10.5)

## 2014-11-21 MED ORDER — HYDROCODONE-ACETAMINOPHEN 10-325 MG PO TABS: 1.0000 | ORAL_TABLET | Freq: Three times a day (TID) | ORAL | Status: DC | PRN

## 2014-11-21 MED ORDER — AMOXICILLIN-POT CLAVULANATE 875-125 MG PO TABS: 1.0000 | ORAL_TABLET | Freq: Two times a day (BID) | ORAL | Status: DC

## 2014-11-21 NOTE — Telephone Encounter (Signed)
Pt has appt to be seen. Closing note.

## 2014-11-21 NOTE — Patient Instructions (Addendum)
NSAIDS ( Aleve, Advil, Naproxen) or Tylenol every 4 hrs as needed for fever as discussed based on label recommendations.  Please go to emergency room if you have any of the warning signs we discussed(pain with eye movement, vision change,pus draining from the eyes, uncontrolled fever ). Please see your Dentist Monday.  Plain Mucinex (NOT D) for thick secretions ;force NON dairy fluids .   Nasal cleansing in the shower as discussed with lather of mild shampoo.After 10 seconds wash off lather while  exhaling through nostrils. Make sure that all residual soap is removed to prevent irritation.  Fluticasone 1 spray in each nostril twice a day as needed. Use the "crossover" technique into opposite nostril spraying toward opposite ear @ 45 degree angle, not straight up into nostril.  Use a Neti pot daily only  as needed for significant sinus congestion; going from open side to congested side . Plain Allegra (NOT D )  160 daily , Loratidine 10 mg , OR Zyrtec 10 mg @ bedtime  as needed for itchy eyes & sneezing.

## 2014-11-21 NOTE — Progress Notes (Signed)
   Subjective:    Patient ID: Philip Zimmerman, male    DOB: Nov 18, 1965, 49 y.o.   MRN: [id number]  HPI He had pain in the left maxillary teeth beginning 11/18/14. As of yesterday he noted swelling in the left cheek. He states that he felt clammy this morning without definite fever or chills. His eyes have been watering w/o purulent discharge.  He's had no dental care for several years.   Review of Systems He denies frontal headache, nasal purulence, sore throat, cough, sputum, wheezing, shortness of breath.  Chest pain, palpitations, tachycardia (he is insensitive to rapid rate), exertional dyspnea, paroxysmal nocturnal dyspnea, claudication or edema are absent.      Objective:   Physical Exam  Erythema of the nasal mucosa bilaterally, worse on the left. The left cheek appears swollen and is tender to deep palpation. There is no redness or increased temperature in this area. The midportion of the left maxillary teeth is tender to deep palpation.No visible caries. Extraocular motion is intact as is vision to confrontation. He has a resting tachycardia without murmur.  General appearance:Thin but adequately nourished; no acute distress or increased work of breathing is present.    Lymphatic: No  lymphadenopathy about the head, neck, or axilla .  Eyes: No conjunctival inflammation or lid edema is present. There is no scleral icterus.  Ears:  External ear exam shows no significant lesions or deformities.  Otoscopic examination reveals clear canals, tympanic membranes are intact bilaterally without bulging, retraction, inflammation or discharge.  Nose:  External nasal examination shows no deformity or inflammation. No septal dislocation or deviation.No obstruction to airflow.   Oral exam: Dental hygiene is good despite symptoms; lips and gums are healthy appearing.There is no oropharyngeal erythema or exudate .  Neck:  No deformities, thyromegaly, masses, or tenderness noted.   Supple  with full range of motion without pain.   Heart:  regular rhythm. S1 and S2 normal without gallop, murmur, click, rub or other extra sounds.   Lungs:Chest clear to auscultation; no wheezes, rhonchi,rales ,or rubs present.  Extremities:  No cyanosis, edema, or clubbing  noted    Skin: Warm & dry w/o tenting  No significant lesions or rash.       Assessment & Plan:  #1 probable dental abscess  Plan: See orders and recommendations

## 2014-11-21 NOTE — Telephone Encounter (Signed)
Patient Name: Philip Zimmerman DOB: Jun 07, 1965 Initial Comment Caller states thinks has sinus infection; left side of face is swollen; Nurse Assessment Nurse: Yetta BarreJones, RN, Miranda Date/Time (Eastern Time): 11/21/2014 8:28:58 AM Confirm and document reason for call. If symptomatic, describe symptoms. ---Caller states he has been having allergy symptoms (sneezing and congestion) for the last couple of days and this morning has swelling in the left side of his face. Has the patient traveled out of the country within the last 30 days? ---No Does the patient require triage? ---Yes Related visit to physician within the last 2 weeks? ---No Does the PT have any chronic conditions? (i.e. diabetes, asthma, etc.) ---Yes List chronic conditions. ---Allergies Guidelines Guideline Title Affirmed Question Affirmed Notes Common Cold Cold with no complications (all triage questions negative) Final Disposition User Home Care Yetta BarreJones, RN, Miranda Comments Already has an appt today at 1:15pm with Dr. Alwyn RenHopper.

## 2014-11-21 NOTE — Progress Notes (Signed)
Pre visit review using our clinic review tool, if applicable. No additional management support is needed unless otherwise documented below in the visit note. 

## 2015-06-01 ENCOUNTER — Telehealth: Payer: Self-pay | Admitting: Internal Medicine

## 2015-06-01 NOTE — Telephone Encounter (Signed)
Patient is requesting to transfer from Philip Zimmerman to [place].  Please advise.

## 2015-06-02 NOTE — Telephone Encounter (Signed)
Ok with me 

## 2015-06-05 NOTE — Telephone Encounter (Signed)
Ok with me 

## 2015-06-08 NOTE — Telephone Encounter (Signed)
Tried to contact patient.  No VM. °

## 2015-06-10 ENCOUNTER — Other Ambulatory Visit (INDEPENDENT_AMBULATORY_CARE_PROVIDER_SITE_OTHER): Payer: BLUE CROSS/BLUE SHIELD

## 2015-06-10 ENCOUNTER — Encounter: Payer: Self-pay | Admitting: Nurse Practitioner

## 2015-06-10 ENCOUNTER — Ambulatory Visit (INDEPENDENT_AMBULATORY_CARE_PROVIDER_SITE_OTHER): Payer: BLUE CROSS/BLUE SHIELD | Admitting: Nurse Practitioner

## 2015-06-10 VITALS — BP 122/80 | HR 89 | Temp 98.1°F | Ht 74.0 in | Wt 183.5 lb

## 2015-06-10 DIAGNOSIS — R1033 Periumbilical pain: Secondary | ICD-10-CM

## 2015-06-10 LAB — COMPREHENSIVE METABOLIC PANEL
ALBUMIN: 4.5 g/dL (ref 3.5–5.2)
ALT: 34 U/L (ref 0–53)
AST: 31 U/L (ref 0–37)
Alkaline Phosphatase: 39 U/L (ref 39–117)
BILIRUBIN TOTAL: 1 mg/dL (ref 0.2–1.2)
BUN: 10 mg/dL (ref 6–23)
CALCIUM: 9.6 mg/dL (ref 8.4–10.5)
CO2: 27 mEq/L (ref 19–32)
Chloride: 103 mEq/L (ref 96–112)
Creatinine, Ser: 1.01 mg/dL (ref 0.40–1.50)
GFR: 101.52 mL/min (ref >60.00)
Glucose, Bld: 97 mg/dL (ref 70–99)
Potassium: 4 mEq/L (ref 3.5–5.1)
Sodium: 140 mEq/L (ref 135–145)
Total Protein: 7.5 g/dL (ref 6.0–8.3)

## 2015-06-10 LAB — CBC WITH DIFFERENTIAL/PLATELET
Basophils Absolute: 0 10*3/uL (ref 0.0–0.1)
Basophils Relative: 0.4 % (ref 0.0–3.0)
EOS ABS: 0 10*3/uL (ref 0.0–0.7)
Eosinophils Relative: 0.5 % (ref 0.0–5.0)
HEMATOCRIT: 42.6 % (ref 39.0–52.0)
Hemoglobin: 14.7 g/dL (ref 13.0–17.0)
LYMPHS PCT: 21.1 % (ref 12.0–46.0)
Lymphs Abs: 2 10*3/uL (ref 0.7–4.0)
MCHC: 34.6 g/dL (ref 30.0–36.0)
MCV: 91.9 fl (ref 78.0–100.0)
MONO ABS: 0.4 10*3/uL (ref 0.1–1.0)
Monocytes Relative: 4.5 % (ref 3.0–12.0)
NEUTROS ABS: 7.1 10*3/uL (ref 1.4–7.7)
Neutrophils Relative %: 73.5 % (ref 43.0–77.0)
PLATELETS: 207 10*3/uL (ref 150.0–400.0)
RBC: 4.63 Mil/uL (ref 4.22–5.81)
RDW: 13.4 % (ref 11.5–15.5)
WBC: 9.7 10*3/uL (ref 4.0–10.5)

## 2015-06-10 LAB — LIPASE: Lipase: 19 U/L (ref 11.0–59.0)

## 2015-06-10 NOTE — Progress Notes (Signed)
Pre visit review using our clinic review tool, if applicable. No additional management support is needed unless otherwise documented below in the visit note. 

## 2015-06-10 NOTE — Assessment & Plan Note (Addendum)
Pt has been having this concern over 20 years In the last week he feels it is worsening  Will obtain CBC w. Diff, cmet, and lipase  Pt getting a physical soon he reports  Asked him to try a probiotic and follow up with his provider.  FU prn worsening/failure to improve.

## 2015-06-10 NOTE — Patient Instructions (Signed)
Try a probiotic  Please take a probiotic ( Align, Floraque or Culturelle) Generic is okay   We will get some labs today. Please make your appointment for a physical before leaving today.

## 2015-06-10 NOTE — Progress Notes (Signed)
Patient ID: Philip Zimmerman, male    DOB: 12-03-1965  Age: 50 y.o. MRN: [id number]  CC: Abdominal Pain   HPI Philip Zimmerman presents for CC of left abdominal pain x 20 years.   1) Pt reports dull and achy  Doesn't interfere with daily activity  Relief with leaning forward and worse after eating  No matter what he eats he has a instant urge for a bowel movement.  2 months ago it wrapped around to back and then went away   1 week of worsening  BMs soft and 3 times a day, usually brown, mucous in stool  Feels bloated  Drinks carbonated water  Denies gas increasing   Treatment to date:  Tylenol   History Philip Zimmerman has a past medical history of ALLERGIC RHINITIS (12/05/2007); GERD (12/05/2007); HYPERLIPIDEMIA (12/05/2007); TRANSAMINASES, SERUM, ELEVATED (03/08/2010); Unspecified disorder of kidney and ureter (03/12/2010); and Hypertriglyceridemia (05/23/2011).   He has no past surgical history on file.   His family history includes Cancer in his maternal grandfather and maternal grandmother; Diabetes in his maternal grandmother; Gout in his father; Heart disease in his father; Hyperlipidemia in his father; Hypertension in his father.He reports that he has been smoking.  He does not have any smokeless tobacco history on file. He reports that he does not drink alcohol or use illicit drugs.  Outpatient Prescriptions Prior to Visit  Medication Sig Dispense Refill  . amoxicillin-clavulanate (AUGMENTIN) 875-125 MG per tablet Take 1 tablet by mouth 2 (two) times daily. 20 tablet 0  . famciclovir (FAMVIR) 500 MG tablet Take 1 tablet (500 mg total) by mouth 3 (three) times daily. 21 tablet 0  . gabapentin (NEURONTIN) 100 MG capsule One pill every eight hours as needed; dose may be increased by one pill each dose after 72 hours if only partially effective 30 capsule 0  . HYDROcodone-acetaminophen (NORCO) 10-325 MG per tablet Take 1 tablet by mouth every 8 (eight) hours as needed. 30 tablet 0  .  tiZANidine (ZANAFLEX) 4 MG tablet Take 1 tablet (4 mg total) by mouth every 8 (eight) hours as needed for muscle spasms. 15 tablet 1  . traMADol (ULTRAM) 50 MG tablet Take 1 tablet (50 mg total) by mouth every 6 (six) hours as needed. 30 tablet 0   No facility-administered medications prior to visit.    ROS Review of Systems  Constitutional: Negative for fever, chills, diaphoresis and fatigue.  Eyes: Negative for visual disturbance.  Respiratory: Negative for chest tightness, shortness of breath and wheezing.   Cardiovascular: Negative for chest pain, palpitations and leg swelling.  Gastrointestinal: Positive for abdominal pain. Negative for nausea, vomiting, diarrhea, constipation, blood in stool, abdominal distention and anal bleeding.  Skin: Negative for rash.  Neurological: Negative for dizziness and numbness.    Objective:  BP 122/80 mmHg  Pulse 89  Temp(Src) 98.1 F (36.7 C) (Oral)  Ht '6\' 2"'  (1.88 m)  Wt 183 lb 8 oz (83.235 kg)  BMI 23.55 kg/m2  SpO2 95%  Physical Exam  Constitutional: He is oriented to person, place, and time. He appears well-developed and well-nourished. No distress.  HENT:  Head: Normocephalic and atraumatic.  Right Ear: External ear normal.  Left Ear: External ear normal.  Cardiovascular: Normal rate, regular rhythm and normal heart sounds.  Exam reveals no gallop and no friction rub.   No murmur heard. Pulmonary/Chest: Effort normal and breath sounds normal. No respiratory distress. He has no wheezes. He has no rales. He exhibits no tenderness.  Abdominal: Soft.  Bowel sounds are normal. He exhibits no distension and no mass. There is no tenderness. There is no rebound and no guarding.  Neurological: He is alert and oriented to person, place, and time.  Skin: Skin is warm and dry. No rash noted. He is not diaphoretic.  Psychiatric: He has a normal mood and affect. His behavior is normal. Judgment and thought content normal.    Assessment & Plan:    Philip Zimmerman was seen today for abdominal pain.  Diagnoses and all orders for this visit:  Periumbilical abdominal pain -     Lipase; Future -     Comp Met (CMET); Future -     CBC with Differential/Platelet; Future   I have discontinued Philip Zimmerman's famciclovir, gabapentin, tiZANidine, traMADol, amoxicillin-clavulanate, and HYDROcodone-acetaminophen.  No orders of the defined types were placed in this encounter.     Follow-up: Return if symptoms worsen or fail to improve.

## 2015-06-30 ENCOUNTER — Encounter: Payer: Self-pay | Admitting: Internal Medicine

## 2015-06-30 ENCOUNTER — Other Ambulatory Visit (INDEPENDENT_AMBULATORY_CARE_PROVIDER_SITE_OTHER): Payer: BLUE CROSS/BLUE SHIELD

## 2015-06-30 ENCOUNTER — Ambulatory Visit (INDEPENDENT_AMBULATORY_CARE_PROVIDER_SITE_OTHER): Payer: BLUE CROSS/BLUE SHIELD | Admitting: Internal Medicine

## 2015-06-30 VITALS — BP 128/90 | HR 85 | Temp 98.5°F | Resp 16 | Ht 74.0 in | Wt 181.0 lb

## 2015-06-30 DIAGNOSIS — K591 Functional diarrhea: Secondary | ICD-10-CM

## 2015-06-30 DIAGNOSIS — E781 Pure hyperglyceridemia: Secondary | ICD-10-CM

## 2015-06-30 DIAGNOSIS — Z0000 Encounter for general adult medical examination without abnormal findings: Secondary | ICD-10-CM | POA: Diagnosis not present

## 2015-06-30 DIAGNOSIS — R7989 Other specified abnormal findings of blood chemistry: Secondary | ICD-10-CM

## 2015-06-30 DIAGNOSIS — K589 Irritable bowel syndrome without diarrhea: Secondary | ICD-10-CM

## 2015-06-30 LAB — PSA: PSA: 0.52 ng/mL (ref 0.10–4.00)

## 2015-06-30 LAB — FECAL OCCULT BLOOD, GUAIAC: FECAL OCCULT BLD: NEGATIVE

## 2015-06-30 LAB — C-REACTIVE PROTEIN: CRP: 0.1 mg/dL — ABNORMAL LOW (ref 0.5–20.0)

## 2015-06-30 LAB — LDL CHOLESTEROL, DIRECT: LDL DIRECT: 51 mg/dL

## 2015-06-30 LAB — TSH: TSH: 1.32 u[IU]/mL (ref 0.35–4.50)

## 2015-06-30 LAB — LIPID PANEL
CHOLESTEROL: 252 mg/dL — AB (ref 0–200)
HDL: 50.4 mg/dL (ref >39.00)
Total CHOL/HDL Ratio: 5
Triglycerides: 1872 mg/dL — ABNORMAL HIGH (ref 0.0–149.0)

## 2015-06-30 MED ORDER — ELUXADOLINE 75 MG PO TABS: 1.0000 | ORAL_TABLET | Freq: Two times a day (BID) | ORAL | Status: DC

## 2015-06-30 MED ORDER — ICOSAPENT ETHYL 1 G PO CAPS: 2.0000 | ORAL_CAPSULE | Freq: Two times a day (BID) | ORAL | Status: DC

## 2015-06-30 MED ORDER — FENOFIBRATE 145 MG PO TABS: 145.0000 mg | ORAL_TABLET | Freq: Every day | ORAL | Status: DC

## 2015-06-30 NOTE — Patient Instructions (Signed)
Irritable Bowel Syndrome, Adult Irritable bowel syndrome (IBS) is not one specific disease. It is a group of symptoms that affects the organs responsible for digestion (gastrointestinal or GI tract).  To regulate how your GI tract works, your body sends signals back and forth between your intestines and your brain. If you have IBS, there may be a problem with these signals. As a result, your GI tract does not function normally. Your intestines may become more sensitive and overreact to certain things. This is especially true when you eat certain foods or when you are under stress.  There are four types of IBS. These may be determined based on the consistency of your stool:   IBS with diarrhea.   IBS with constipation.   Mixed IBS.   Unsubtyped IBS.  It is important to know which type of IBS you have. Some treatments are more likely to be helpful for certain types of IBS.  CAUSES  The exact cause of IBS is not known. RISK FACTORS You may have a higher risk of IBS if:  You are a woman.  You are younger than 48 years old.  You have a family history of IBS.  You have mental health problems.  You have had bacterial infection of your GI tract. SIGNS AND SYMPTOMS  Symptoms of IBS vary from person to person. The main symptom is abdominal pain or discomfort. Additional symptoms usually include one or more of the following:   Diarrhea, constipation, or both.   Abdominal swelling or bloating.   Feeling full or sick after eating a small or regular-size meal.   Frequent gas.   Mucus in the stool.   A feeling of having more stool left after a bowel movement.  Symptoms tend to come and go. They may be associated with stress, psychiatric conditions, or nothing at all.  DIAGNOSIS  There is no specific test to diagnose IBS. Your health care provider will make a diagnosis based on a physical exam, medical history, and your symptoms. You may have other tests to rule out other  conditions that may be causing your symptoms. These may include:   Blood tests.   X-rays.   CT scan.  Endoscopy and colonoscopy. This is a test in which your GI tract is viewed with a long, thin, flexible tube. TREATMENT There is no cure for IBS, but treatment can help relieve symptoms. IBS treatment often includes:   Changes to your diet, such as:  Eating more fiber.  Avoiding foods that cause symptoms.  Drinking more water.  Eating regular, medium-sized portioned meals.  Medicines. These may include:  Fiber supplements if you have constipation.  Medicine to control diarrhea (antidiarrheal medicines).  Medicine to help control muscle spasms in your GI tract (antispasmodic medicines).  Medicines to help with any mental health issues, such as antidepressants or tranquilizers.  Therapy.  Talk therapy may help with anxiety, depression, or other mental health issues that can make IBS symptoms worse.  Stress reduction.  Managing your stress can help keep symptoms under control. HOME CARE INSTRUCTIONS   Take medicines only as directed by your health care provider.  Eat a healthy diet.  Avoid foods and drinks with added sugar.  Include more whole grains, fruits, and vegetables gradually into your diet. This may be especially helpful if you have IBS with constipation.  Avoid any foods and drinks that make your symptoms worse. These may include dairy products and caffeinated or carbonated drinks.  Do not eat large meals.    Drink enough fluid to keep your urine clear or pale yellow.  Exercise regularly. Ask your health care provider for recommendations of good activities for you.  Keep all follow-up visits as directed by your health care provider. This is important. SEEK MEDICAL CARE IF:   You have constant pain.  You have trouble or pain with swallowing.  You have worsening diarrhea. SEEK IMMEDIATE MEDICAL CARE IF:   You have severe and worsening abdominal  pain.   You have diarrhea and:   You have a rash, stiff neck, or severe headache.   You are irritable, sleepy, or difficult to awaken.   You are weak, dizzy, or extremely thirsty.   You have bright red blood in your stool or you have black tarry stools.   You have unusual abdominal swelling that is painful.   You vomit continuously.   You vomit blood (hematemesis).   You have both abdominal pain and a fever.    This information is not intended to replace advice given to you by your health care provider. Make sure you discuss any questions you have with your health care provider.   Document Released: 04/25/2005 Document Revised: 05/16/2014 Document Reviewed: 01/10/2014 [employer] Interactive Patient Education 2016 [employer]  

## 2015-06-30 NOTE — Progress Notes (Signed)
Subjective:  Patient ID: Philip Zimmerman, male    DOB: [birth year]  Age: 50 y.o. MRN: [id number]  CC: Annual Exam; Hyperlipidemia; and Abdominal Pain   HPI Zakai Gonyea presents for a complete physical but he also complains of intermittent episodes of abdominal pain and diarrhea for over 2 decades. He has about 5 loose bowel movements a day with some urgency. He also has intermittent left upper quadrant pain that he describes as cramping that started when he was in college. He occasionally sees mucus in his stool but never any blood. He denies loss of appetite, nausea, vomiting, heartburn, odynophagia or dysphagia. He had an MRI of his abdomen and an ultrasound about 5 or 6 years ago that was unremarkable. He has tried a probiotic without much relief from his symptoms.  He also has a history of hypertriglyceridemia which he is not currently treating.  No outpatient prescriptions prior to visit.   No facility-administered medications prior to visit.    ROS Review of Systems  Constitutional: Negative.  Negative for appetite change and unexpected weight change.  HENT: Negative.  Negative for trouble swallowing.   Eyes: Negative.   Respiratory: Negative.  Negative for cough, choking, chest tightness, shortness of breath and stridor.   Cardiovascular: Negative.  Negative for chest pain, palpitations and leg swelling.  Gastrointestinal: Positive for abdominal pain and diarrhea. Negative for nausea, vomiting, constipation, blood in stool, abdominal distention, anal bleeding and rectal pain.  Endocrine: Negative.   Genitourinary: Negative.  Negative for dysuria, urgency, frequency, hematuria, flank pain, scrotal swelling and difficulty urinating.  Musculoskeletal: Negative.  Negative for arthralgias.  Skin: Negative.  Negative for color change and rash.  Allergic/Immunologic: Negative.   Neurological: Negative.  Negative for dizziness.  Hematological: Negative.  Negative for adenopathy.  Does not bruise/bleed easily.  Psychiatric/Behavioral: Negative.     Objective:  BP 128/90 mmHg  Pulse 85  Temp(Src) 98.5 F (36.9 C) (Oral)  Resp 16  Ht  (1.88 m)  Wt 181 lb (82.101 kg)  BMI 23.23 kg/m2  SpO2 97%  BP Readings from Last 3 Encounters:  06/30/15 128/90  06/10/15 122/80  11/21/14 118/78    Wt Readings from Last 3 Encounters:  06/30/15 181 lb (82.101 kg)  06/10/15 183 lb 8 oz (83.235 kg)  11/21/14 177 lb (80.287 kg)    Physical Exam  Constitutional: He is oriented to person, place, and time. He appears well-developed and well-nourished. No distress.  HENT:  Head: Normocephalic and atraumatic.  Mouth/Throat: Oropharynx is clear and moist. No oropharyngeal exudate.  Eyes: Conjunctivae are normal. Right eye exhibits no discharge. Left eye exhibits no discharge. No scleral icterus.  Neck: Normal range of motion. Neck supple. No JVD present. No tracheal deviation present. No thyromegaly present.  Cardiovascular: Normal rate, regular rhythm, normal heart sounds and intact distal pulses.  Exam reveals no gallop and no friction rub.   No murmur heard. EKG - NSR, normal EKG  Pulmonary/Chest: Effort normal and breath sounds normal. No stridor. No respiratory distress. He has no wheezes. He has no rales. He exhibits no tenderness.  Abdominal: Soft. Bowel sounds are normal. He exhibits no distension and no mass. There is no tenderness. There is no rebound and no guarding. Hernia confirmed negative in the right inguinal area and confirmed negative in the left inguinal area.  Genitourinary: Rectum normal, prostate normal, testes normal and penis normal. Rectal exam shows no external hemorrhoid, no internal hemorrhoid, no fissure, no mass, no tenderness and anal  tone normal. Guaiac negative stool. Prostate is not enlarged and not tender. Right testis shows no mass, no swelling and no tenderness. Right testis is descended. Left testis shows no mass, no swelling and no  tenderness. Left testis is descended. Circumcised. No penile erythema or penile tenderness. No discharge found.  Musculoskeletal: Normal range of motion. He exhibits no edema or tenderness.  Lymphadenopathy:    He has no cervical adenopathy.       Right: No inguinal adenopathy present.       Left: No inguinal adenopathy present.  Neurological: He is oriented to person, place, and time.  Skin: Skin is warm and dry. No rash noted. He is not diaphoretic. No erythema. No pallor.  Psychiatric: He has a normal mood and affect. His behavior is normal. Judgment and thought content normal.  Vitals reviewed.   Lab Results  Component Value Date   WBC 9.7 06/10/2015   HGB 14.7 06/10/2015   HCT 42.6 06/10/2015   PLT 207.0 06/10/2015   GLUCOSE 97 06/10/2015   CHOL 252* 06/30/2015   TRIG * 06/30/2015    1872.0 Triglyceride is over 400; calculations on Lipids are invalid.   HDL 50.40 06/30/2015   LDLDIRECT 51.0 06/30/2015   LDLCALC 67 08/21/2013   ALT 34 06/10/2015   AST 31 06/10/2015   NA 140 06/10/2015   K 4.0 06/10/2015   CL 103 06/10/2015   CREATININE 1.01 06/10/2015   BUN 10 06/10/2015   CO2 27 06/10/2015   TSH 1.32 06/30/2015   PSA 0.52 06/30/2015    Dg Lumbar Spine Complete  08/04/2014  CLINICAL DATA:  Five-day history of low back pain without known injury; there are left-sided sciatic symptoms. EXAM: LUMBAR SPINE - COMPLETE 4+ VIEW COMPARISON:  AP view of the lumbar spine and abdomen of March 18, 2014 FINDINGS: The lumbar vertebral bodies are preserved in height. There is mild loss of the normal lumbar lordosis. There is grade 1 retrolisthesis of L5 with respect to S1 and L4 with respect to L5. There is mild narrowing of the L4-5 disc space. The pedicles and transverse processes are intact. IMPRESSION: There is mild degenerative disc and facet joint change in the lower lumbar spine. There is no compression fracture nor high-grade disc space narrowing. Electronically Signed   By:  David  [place]   On: 08/04/2014 12:20    Assessment & Plan:   Aydenn was seen today for annual exam, hyperlipidemia and abdominal pain.  Diagnoses and all orders for this visit:  Routine general medical examination at a health care facility- he refuses a flu vaccine, exam completed, labs ordered and reviewed, his EKG is normal, patient education material was given. -     Lipid panel; Future -     PSA; Future -     TSH; Future -     HIV antibody; Future -     EKG 12-Lead  IBS (irritable bowel syndrome)- will treat with Viberzi -     Eluxadoline (VIBERZI) 75 MG TABS; Take 1 tablet by mouth 2 (two) times daily.  Functional diarrhea - I will screen for celiac dz. I doubt IBD as his CRP is normal , his other labs are all normal so I am not concerned about metabolic dz. or malabsorption. -     Eluxadoline (VIBERZI) 75 MG TABS; Take 1 tablet by mouth 2 (two) times daily. -     Gliadin antibodies, serum -     Tissue transglutaminase, IgA -  Reticulin Antibody, IgA w reflex titer -     C-reactive protein; Future  Hypertriglyceridemia- will start treating with tricor and vascepa -     Icosapent Ethyl (VASCEPA) 1 g CAPS; Take 2 capsules by mouth 2 (two) times daily. -     fenofibrate (TRICOR) 145 MG tablet; Take 1 tablet (145 mg total) by mouth daily.  I have discontinued Mr. Emigh's fenofibrate. I am also having him start on Eluxadoline, Icosapent Ethyl, and fenofibrate.  Meds ordered this encounter  Medications  . Eluxadoline (VIBERZI) 75 MG TABS    Sig: Take 1 tablet by mouth 2 (two) times daily.    Dispense:  90 tablet    Refill:  1  . Icosapent Ethyl (VASCEPA) 1 g CAPS    Sig: Take 2 capsules by mouth 2 (two) times daily.    Dispense:  120 capsule    Refill:  11  . fenofibrate (TRICOR) 145 MG tablet    Sig: Take 1 tablet (145 mg total) by mouth daily.    Dispense:  30 tablet    Refill:  11     Follow-up: Return in about 4 months (around 10/28/2015).  Sanda Linger,  MD

## 2015-06-30 NOTE — Progress Notes (Signed)
Pre visit review using our clinic review tool, if applicable. No additional management support is needed unless otherwise documented below in the visit note. 

## 2015-07-01 ENCOUNTER — Encounter: Payer: Self-pay | Admitting: Internal Medicine

## 2015-07-01 LAB — HIV ANTIBODY (ROUTINE TESTING W REFLEX): HIV 1&2 Ab, 4th Generation: NONREACTIVE

## 2015-07-02 ENCOUNTER — Encounter: Payer: Self-pay | Admitting: Internal Medicine

## 2016-01-14 ENCOUNTER — Ambulatory Visit (INDEPENDENT_AMBULATORY_CARE_PROVIDER_SITE_OTHER): Payer: BLUE CROSS/BLUE SHIELD | Admitting: Nurse Practitioner

## 2016-01-14 VITALS — BP 118/84 | HR 108 | Temp 98.2°F | Resp 16 | Wt 182.0 lb

## 2016-01-14 DIAGNOSIS — R1012 Left upper quadrant pain: Secondary | ICD-10-CM | POA: Diagnosis not present

## 2016-01-14 DIAGNOSIS — E781 Pure hyperglyceridemia: Secondary | ICD-10-CM

## 2016-01-14 DIAGNOSIS — K589 Irritable bowel syndrome without diarrhea: Secondary | ICD-10-CM | POA: Diagnosis not present

## 2016-01-14 MED ORDER — ICOSAPENT ETHYL 0.5 G PO CAPS: 2.0000 g | ORAL_CAPSULE | Freq: Two times a day (BID) | ORAL | 2 refills | Status: DC

## 2016-01-14 NOTE — Progress Notes (Signed)
Subjective:  Patient ID: Philip Zimmerman, male    DOB: 13-Apr-1966  Age: 50 y.o. MRN: [id number]  CC: Abdominal Pain (LUQ, intermittent x several months)   Philip Zimmerman presents for LUQ ABD pain.  Abdominal Pain  This is a recurrent problem. The current episode started more than 1 month ago. The onset quality is gradual. The problem occurs intermittently. The problem has been waxing and waning. The pain is located in the LUQ. The pain is at a severity of 2/10. The pain is mild. The quality of the pain is colicky. The abdominal pain does not radiate. Pertinent negatives include no anorexia, constipation, diarrhea, dysuria, fever, hematochezia, hematuria, melena, myalgias, nausea, vomiting or weight loss. Nothing aggravates the pain. The pain is relieved by nothing. He has tried nothing for the symptoms.  Symptoms have not changed since last visit with Dr. Yetta Barre Denies any new complaint.  He got concerned yesterday when he experienced a brief episode of chest wall spasm. Which lasted for less than a minute. Denies any diaphoresis, or dizziness, or shortness of breath, or chest pain during that episode. He has not been taking viberzi. He has not been taking Vascepa, due to capsule size. He took Scientist, water quality for 30 days and stopped.  Outpatient Medications Prior to Visit  Medication Sig Dispense Refill  . fenofibrate (TRICOR) 145 MG tablet Take 1 tablet (145 mg total) by mouth daily. 30 tablet 11  . Icosapent Ethyl (VASCEPA) 1 g CAPS Take 2 capsules by mouth 2 (two) times daily. 120 capsule 11  . Eluxadoline (VIBERZI) 75 MG TABS Take 1 tablet by mouth 2 (two) times daily. (Patient not taking: Reported on 01/14/2016) 90 tablet 1   No facility-administered medications prior to visit.     ROS See HPI  Objective:  BP 118/84   Pulse (!) 108   Temp 98.2 F (36.8 C) (Oral)   Resp 16   Wt 182 lb (82.6 kg)   SpO2 97%   BMI 23.37 kg/m   BP Readings from Last 3 Encounters:  01/14/16 118/84    06/30/15 128/90  06/10/15 122/80    Wt Readings from Last 3 Encounters:  01/14/16 182 lb (82.6 kg)  06/30/15 181 lb (82.1 kg)  06/10/15 183 lb 8 oz (83.2 kg)    Physical Exam  Constitutional: He is oriented to person, place, and time. He appears well-developed.  Neck: Normal range of motion. Neck supple. No thyromegaly present.  Cardiovascular: Normal rate, regular rhythm, normal heart sounds and intact distal pulses.   Pulmonary/Chest: Effort normal and breath sounds normal.  Abdominal: Soft. Bowel sounds are normal. He exhibits no distension.  Musculoskeletal: He exhibits no edema.  Lymphadenopathy:    He has no cervical adenopathy.  Neurological: He is alert and oriented to person, place, and time.  Skin: Skin is warm and dry.  Psychiatric: He has a normal mood and affect. His behavior is normal.  Vitals reviewed.   Lab Results  Component Value Date   WBC 9.7 06/10/2015   HGB 14.7 06/10/2015   HCT 42.6 06/10/2015   PLT 207.0 06/10/2015   GLUCOSE 97 06/10/2015   CHOL 252 (H) 06/30/2015   TRIG (H) 06/30/2015    1872.0 Triglyceride is over 400; calculations on Lipids are invalid.   HDL 50.40 06/30/2015   LDLDIRECT 51.0 06/30/2015   LDLCALC 67 08/21/2013   ALT 34 06/10/2015   AST 31 06/10/2015   NA 140 06/10/2015   K 4.0 06/10/2015   CL 103 06/10/2015  CREATININE 1.01 06/10/2015   BUN 10 06/10/2015   CO2 27 06/10/2015   TSH 1.32 06/30/2015   PSA 0.52 06/30/2015    Dg Lumbar Spine Complete  Result Date: 08/04/2014 CLINICAL DATA:  Five-day history of low back pain without known injury; there are left-sided sciatic symptoms. EXAM: LUMBAR SPINE - COMPLETE 4+ VIEW COMPARISON:  AP view of the lumbar spine and abdomen of March 18, 2014 FINDINGS: The lumbar vertebral bodies are preserved in height. There is mild loss of the normal lumbar lordosis. There is grade 1 retrolisthesis of L5 with respect to S1 and L4 with respect to L5. There is mild narrowing of the L4-5  disc space. The pedicles and transverse processes are intact. IMPRESSION: There is mild degenerative disc and facet joint change in the lower lumbar spine. There is no compression fracture nor high-grade disc space narrowing. Electronically Signed   By: David  [place]   On: 08/04/2014 12:20    Assessment & Plan:  Take medication as prescribed and follow-up in one month. Call 911 if develops palpitations that are associated with chest pain shortness of breath diaphoresis or dizziness.  Philip Zimmerman was seen today for abdominal pain.  Diagnoses and all orders for this visit:  Colicky LUQ abdominal pain  Hypertriglyceridemia -     Icosapent Ethyl (VASCEPA) 0.5 g CAPS; Take 2 g by mouth 2 (two) times daily after a meal. -     Lipid Panel w/Direct LDL:HDL Ratio; Future  IBS (irritable bowel syndrome)   I have discontinued Philip Zimmerman's Icosapent Ethyl. I am also having him start on Icosapent Ethyl. Additionally, I am having him maintain his Eluxadoline and fenofibrate.  Meds ordered this encounter  Medications  . Icosapent Ethyl (VASCEPA) 0.5 g CAPS    Sig: Take 2 g by mouth 2 (two) times daily after a meal.    Dispense:  240 capsule    Refill:  2    Order Specific Question:   Supervising Provider    Answer:   Tresa GarterPLOTNIKOV, ALEKSEI V [[id number]]    Follow-up: Return in about 1 month (around 02/13/2016) for hypertriglyceride and LUQ ABD pain.  Alysia Pennaharlotte Morgon Pamer, NP

## 2016-01-14 NOTE — Patient Instructions (Addendum)
Take medications as prescribed.  Palpitations A palpitation is the feeling that your heartbeat is irregular or is faster than normal. It may feel like your heart is fluttering or skipping a beat. Palpitations are usually not a serious problem. However, in some cases, you may need further medical evaluation. CAUSES  Palpitations can be caused by:  Smoking.  Caffeine or other stimulants, such as diet pills or energy drinks.  Alcohol.  Stress and anxiety.  Strenuous physical activity.  Fatigue.  Certain medicines.  Heart disease, especially if you have a history of irregular heart rhythms (arrhythmias), such as atrial fibrillation, atrial flutter, or supraventricular tachycardia.  An improperly working pacemaker or defibrillator. DIAGNOSIS  To find the cause of your palpitations, your health care provider will take your medical history and perform a physical exam. Your health care provider may also have you take a test called an ambulatory electrocardiogram (ECG). An ECG records your heartbeat patterns over a 24-hour period. You may also have other tests, such as:  Transthoracic echocardiogram (TTE). During echocardiography, sound waves are used to evaluate how blood flows through your heart.  Transesophageal echocardiogram (TEE).  Cardiac monitoring. This allows your health care provider to monitor your heart rate and rhythm in real time.  Holter monitor. This is a portable device that records your heartbeat and can help diagnose heart arrhythmias. It allows your health care provider to track your heart activity for several days, if needed.  Stress tests by exercise or by giving medicine that makes the heart beat faster. TREATMENT  Treatment of palpitations depends on the cause of your symptoms and can vary greatly. Most cases of palpitations do not require any treatment other than time, relaxation, and monitoring your symptoms. Other causes, such as atrial fibrillation, atrial  flutter, or supraventricular tachycardia, usually require further treatment. HOME CARE INSTRUCTIONS   Avoid:  Caffeinated coffee, tea, soft drinks, diet pills, and energy drinks.  Chocolate.  Alcohol.  Stop smoking if you smoke.  Reduce your stress and anxiety. Things that can help you relax include:  A method of controlling things in your body, such as your heartbeats, with your mind (biofeedback).  Yoga.  Meditation.  Physical activity such as swimming, jogging, or walking.  Get plenty of rest and sleep. SEEK MEDICAL CARE IF:   You continue to have a fast or irregular heartbeat beyond 24 hours.  Your palpitations occur more often. SEEK IMMEDIATE MEDICAL CARE IF:  You have chest pain or shortness of breath.  You have a severe headache.  You feel dizzy or you faint. MAKE SURE YOU:  Understand these instructions.  Will watch your condition.  Will get help right away if you are not doing well or get worse.   This information is not intended to replace advice given to you by your health care provider. Make sure you discuss any questions you have with your health care provider.   Document Released: 04/22/2000 Document Revised: 04/30/2013 Document Reviewed: 06/24/2011 Elsevier Interactive Patient Education [employer]2016 [employer] Inc.

## 2016-01-14 NOTE — Progress Notes (Signed)
Pre visit review using our clinic review tool, if applicable. No additional management support is needed unless otherwise documented below in the visit note. 

## 2016-01-15 ENCOUNTER — Encounter: Payer: Self-pay | Admitting: Nurse Practitioner

## 2016-01-15 NOTE — Assessment & Plan Note (Signed)
Encouraged patient to take TriCor and Vascepa as prescribed. Vascepa was changed to 0.5 g capsules which are smaller. Daily dose was maintained at 2 g twice a day with food. She is also encouraged to maintain a low-fat diet. Also encouraged to quit tobacco use. Return to office in 1 month for lipid panel.

## 2016-01-15 NOTE — Assessment & Plan Note (Signed)
Encouraged patient to take TriCor and Vascepa as prescribed. Vascepa was changed to 0.5 g capsules which are smaller. Daily dose was maintained at 2 g twice a day with food. She is also encouraged to maintain a low-fat diet. Return to office in 1 month for lipid panel.

## 2016-01-15 NOTE — Assessment & Plan Note (Signed)
Encouraged to start Viberzi today as prescribed. With normal lipase and CMP, and absence of acute GI symptoms; no need for ABD US or ABD CT today.

## 2016-02-18 ENCOUNTER — Ambulatory Visit: Payer: BLUE CROSS/BLUE SHIELD | Admitting: Internal Medicine

## 2016-04-28 ENCOUNTER — Other Ambulatory Visit: Payer: BLUE CROSS/BLUE SHIELD

## 2016-04-28 ENCOUNTER — Other Ambulatory Visit: Payer: Self-pay | Admitting: Nurse Practitioner

## 2016-04-28 DIAGNOSIS — E781 Pure hyperglyceridemia: Secondary | ICD-10-CM | POA: Diagnosis not present

## 2016-04-29 LAB — LIPID PANEL W/REFLEX DIRECT LDL
Cholesterol: 208 mg/dL — ABNORMAL HIGH (ref <200)
HDL: 45 mg/dL (ref >40)
NON-HDL CHOLESTEROL (CALC): 163 mg/dL — AB (ref <130)
Total CHOL/HDL Ratio: 4.6 Ratio (ref <5.0)
Triglycerides: 855 mg/dL — ABNORMAL HIGH (ref <150)

## 2016-04-29 LAB — LDL CHOLESTEROL, DIRECT: Direct LDL: 62 mg/dL (ref <130)

## 2016-05-03 ENCOUNTER — Telehealth: Payer: Self-pay

## 2016-05-03 DIAGNOSIS — E781 Pure hyperglyceridemia: Secondary | ICD-10-CM

## 2016-05-03 MED ORDER — FENOFIBRATE 145 MG PO TABS: 145.0000 mg | ORAL_TABLET | Freq: Every day | ORAL | 3 refills | Status: DC

## 2016-05-03 NOTE — Telephone Encounter (Signed)
Ok for restart tricor

## 2016-05-03 NOTE — Telephone Encounter (Signed)
Patient came in requesting lab results---his cholesterol numbers have improved, but patient is wanting to know if he can start fenofibrate again----he has taken it before and his cholesterol numbers were much closer to normal---routing to dr Jonny Ruizjohn in the absence of dr Charm Bargesjones---will you be ok with refilling fenofibrate now before dr Yetta Barrejones comes back---patient's insurance benefits run out at end of this year, was needing rx called in before end of 2017---please advise, I will call patient back, thanks

## 2016-05-03 NOTE — Addendum Note (Signed)
Addended by: Corwin LevinsJOHN, JAMES W on: 05/03/2016 04:07 PM   Modules accepted: Orders

## 2016-05-04 NOTE — Telephone Encounter (Signed)
Tried to contact patient on phone---voice mailbox not set up,no way to leave phone message---I have sent mychart message to patient to advise that per dr Jonny Ruizjohn, it is ok to restart fenofibrate and prescription has  Been sent to [facility]

## 2017-05-16 ENCOUNTER — Encounter: Payer: Self-pay | Admitting: Internal Medicine

## 2017-05-16 ENCOUNTER — Ambulatory Visit (INDEPENDENT_AMBULATORY_CARE_PROVIDER_SITE_OTHER): Payer: BLUE CROSS/BLUE SHIELD | Admitting: Internal Medicine

## 2017-05-16 ENCOUNTER — Other Ambulatory Visit (INDEPENDENT_AMBULATORY_CARE_PROVIDER_SITE_OTHER): Payer: BLUE CROSS/BLUE SHIELD

## 2017-05-16 VITALS — BP 128/80 | HR 80 | Temp 98.0°F | Resp 16 | Ht 74.0 in | Wt 177.0 lb

## 2017-05-16 DIAGNOSIS — Z0000 Encounter for general adult medical examination without abnormal findings: Secondary | ICD-10-CM

## 2017-05-16 DIAGNOSIS — E781 Pure hyperglyceridemia: Secondary | ICD-10-CM | POA: Diagnosis not present

## 2017-05-16 LAB — LIPID PANEL
CHOLESTEROL: 169 mg/dL (ref 0–200)
HDL: 49.1 mg/dL (ref >39.00)
Total CHOL/HDL Ratio: 3
Triglycerides: 406 mg/dL — ABNORMAL HIGH (ref 0.0–149.0)

## 2017-05-16 LAB — PSA: PSA: 0.59 ng/mL (ref 0.10–4.00)

## 2017-05-16 LAB — LDL CHOLESTEROL, DIRECT: LDL DIRECT: 60 mg/dL

## 2017-05-16 MED ORDER — OMEGA-3-ACID ETHYL ESTERS 1 G PO CAPS: 2.0000 g | ORAL_CAPSULE | Freq: Two times a day (BID) | ORAL | 1 refills | Status: DC

## 2017-05-16 NOTE — Progress Notes (Signed)
Subjective:  Patient ID: Philip Zimmerman, male    DOB: 05-12-1965  Age: 52 y.o. MRN: [id number]  CC: Annual Exam and Hyperlipidemia   HPI Philip Zimmerman presents for a CPX.  He is working on his lifestyle modifications to control his triglycerides.  He has been taking vascepa but not TriCor.  He's hadd no recent episodes of abdominal pain.  His bowel movements have been normal lately so he has stopped taking Viberzi.   Outpatient Medications Prior to Visit  Medication Sig Dispense Refill  . fenofibrate (TRICOR) 145 MG tablet Take 1 tablet (145 mg total) by mouth daily. 90 tablet 3  . Icosapent Ethyl (VASCEPA) 0.5 g CAPS Take 2 g by mouth 2 (two) times daily after a meal. 240 capsule 2  . Eluxadoline (VIBERZI) 75 MG TABS Take 1 tablet by mouth 2 (two) times daily. (Patient not taking: Reported on 01/14/2016) 90 tablet 1   No facility-administered medications prior to visit.     ROS Review of Systems  Constitutional: Negative.  Negative for appetite change, diaphoresis, fatigue and unexpected weight change.  HENT: Negative.   Eyes: Negative.  Negative for visual disturbance.  Respiratory: Negative for cough, chest tightness, shortness of breath and wheezing.   Cardiovascular: Negative for chest pain, palpitations and leg swelling.  Gastrointestinal: Negative.  Negative for abdominal pain, constipation, diarrhea, nausea and vomiting.  Endocrine: Negative.   Genitourinary: Negative.  Negative for difficulty urinating, penile swelling, scrotal swelling, testicular pain and urgency.  Musculoskeletal: Negative.  Negative for back pain, myalgias and neck pain.  Skin: Negative.   Allergic/Immunologic: Negative.   Neurological: Negative.  Negative for dizziness.  Hematological: Negative for adenopathy. Does not bruise/bleed easily.  Psychiatric/Behavioral: Negative.     Objective:  BP 128/80 (BP Location: Left Arm, Patient Position: Sitting, Cuff Size: Normal)   Pulse 80   Temp 98  F (36.7 C) (Oral)   Resp 16   Ht 6\' 2"  (1.88 m)   Wt 177 lb (80.3 kg)   SpO2 97%   BMI 22.73 kg/m   BP Readings from Last 3 Encounters:  05/16/17 128/80  01/14/16 118/84  06/30/15 128/90    Wt Readings from Last 3 Encounters:  05/16/17 177 lb (80.3 kg)  01/14/16 182 lb (82.6 kg)  06/30/15 181 lb (82.1 kg)    Physical Exam  Constitutional: No distress.  HENT:  Mouth/Throat: Oropharynx is clear and moist. No oropharyngeal exudate.  Eyes: Conjunctivae are normal. Left eye exhibits no discharge. No scleral icterus.  Neck: Normal range of motion. Neck supple. No JVD present. No thyromegaly present.  Cardiovascular: Normal rate, regular rhythm and normal heart sounds. Exam reveals no gallop.  No murmur heard. Pulmonary/Chest: Effort normal and breath sounds normal. No respiratory distress. He has no wheezes. He has no rales.  Abdominal: Soft. Bowel sounds are normal. He exhibits no mass. There is no tenderness. Hernia confirmed negative in the right inguinal area and confirmed negative in the left inguinal area.  Genitourinary: Rectum normal, prostate normal, testes normal and penis normal. Rectal exam shows no external hemorrhoid, no mass, no tenderness and guaiac negative stool. Prostate is not enlarged and not tender. Right testis shows no mass, no swelling and no tenderness. Right testis is descended. Left testis shows no mass, no swelling and no tenderness. Left testis is descended. Circumcised. No penile erythema or penile tenderness. No discharge found.  Lymphadenopathy:    He has no cervical adenopathy.       Right: No inguinal  adenopathy present.       Left: No inguinal adenopathy present.  Skin: He is not diaphoretic.  Vitals reviewed.   Lab Results  Component Value Date   WBC 9.7 06/10/2015   HGB 14.7 06/10/2015   HCT 42.6 06/10/2015   PLT 207.0 06/10/2015   GLUCOSE 97 06/10/2015   CHOL 169 05/16/2017   TRIG (H) 05/16/2017    406.0 Triglyceride is over 400;  calculations on Lipids are invalid.   HDL 49.10 05/16/2017   LDLDIRECT 60.0 05/16/2017   LDLCALC 67 08/21/2013   ALT 34 06/10/2015   AST 31 06/10/2015   NA 140 06/10/2015   K 4.0 06/10/2015   CL 103 06/10/2015   CREATININE 1.01 06/10/2015   BUN 10 06/10/2015   CO2 27 06/10/2015   TSH 1.32 06/30/2015   PSA 0.59 05/16/2017    Dg Lumbar Spine Complete  Result Date: 08/04/2014 CLINICAL DATA:  Five-day history of low back pain without known injury; there are left-sided sciatic symptoms. EXAM: LUMBAR SPINE - COMPLETE 4+ VIEW COMPARISON:  AP view of the lumbar spine and abdomen of March 18, 2014 FINDINGS: The lumbar vertebral bodies are preserved in height. There is mild loss of the normal lumbar lordosis. There is grade 1 retrolisthesis of L5 with respect to S1 and L4 with respect to L5. There is mild narrowing of the L4-5 disc space. The pedicles and transverse processes are intact. IMPRESSION: There is mild degenerative disc and facet joint change in the lower lumbar spine. There is no compression fracture nor high-grade disc space narrowing. Electronically Signed   By: David  [place]   On: 08/04/2014 12:20    Assessment & Plan:   Philip Zimmerman was seen today for annual exam and hyperlipidemia.  Diagnoses and all orders for this visit:  Hypertriglyceridemia- his triglycerides are down to 406.  I think in addition to lifestyle modifications he can control his triglycerides with LOVAZA.  He will continue to improve his lifestyle modifications. -     omega-3 acid ethyl esters (LOVAZA) 1 g capsule; Take 2 capsules (2 g total) by mouth 2 (two) times daily.  Routine general medical examination at a health care facility- exam completed, labs reviewed, he refused a flu vaccine, patient education material was given. -     Lipid panel; Future -     PSA; Future   I have discontinued Philip Zimmerman Zimmerman's Eluxadoline and fenofibrate. I have also changed his Icosapent Ethyl to omega-3 acid ethyl  esters.  Meds ordered this encounter  Medications  . omega-3 acid ethyl esters (LOVAZA) 1 g capsule    Sig: Take 2 capsules (2 g total) by mouth 2 (two) times daily.    Dispense:  360 capsule    Refill:  1     Follow-up: Return in about 1 year (around 05/16/2018).  Sanda Lingerhomas Lynnsie Linders, MD

## 2017-05-16 NOTE — Patient Instructions (Signed)

## 2017-05-17 ENCOUNTER — Encounter: Payer: Self-pay | Admitting: Internal Medicine

## 2018-06-27 ENCOUNTER — Encounter: Payer: BLUE CROSS/BLUE SHIELD | Admitting: Internal Medicine

## 2018-07-11 ENCOUNTER — Other Ambulatory Visit (INDEPENDENT_AMBULATORY_CARE_PROVIDER_SITE_OTHER): Payer: No Typology Code available for payment source

## 2018-07-11 ENCOUNTER — Ambulatory Visit (INDEPENDENT_AMBULATORY_CARE_PROVIDER_SITE_OTHER): Payer: BLUE CROSS/BLUE SHIELD | Admitting: Internal Medicine

## 2018-07-11 ENCOUNTER — Encounter: Payer: Self-pay | Admitting: Internal Medicine

## 2018-07-11 VITALS — BP 138/82 | HR 90 | Temp 98.4°F | Resp 16 | Ht 74.0 in | Wt 175.0 lb

## 2018-07-11 DIAGNOSIS — Z125 Encounter for screening for malignant neoplasm of prostate: Secondary | ICD-10-CM

## 2018-07-11 DIAGNOSIS — E781 Pure hyperglyceridemia: Secondary | ICD-10-CM | POA: Diagnosis not present

## 2018-07-11 DIAGNOSIS — R945 Abnormal results of liver function studies: Secondary | ICD-10-CM | POA: Diagnosis not present

## 2018-07-11 DIAGNOSIS — I10 Essential (primary) hypertension: Secondary | ICD-10-CM | POA: Diagnosis not present

## 2018-07-11 DIAGNOSIS — Z0000 Encounter for general adult medical examination without abnormal findings: Secondary | ICD-10-CM

## 2018-07-11 DIAGNOSIS — K219 Gastro-esophageal reflux disease without esophagitis: Secondary | ICD-10-CM | POA: Diagnosis not present

## 2018-07-11 DIAGNOSIS — R7989 Other specified abnormal findings of blood chemistry: Secondary | ICD-10-CM

## 2018-07-11 DIAGNOSIS — Z1211 Encounter for screening for malignant neoplasm of colon: Secondary | ICD-10-CM

## 2018-07-11 LAB — URINALYSIS, ROUTINE W REFLEX MICROSCOPIC
Bilirubin Urine: NEGATIVE
Hgb urine dipstick: NEGATIVE
Ketones, ur: NEGATIVE
Leukocytes,Ua: NEGATIVE
Nitrite: NEGATIVE
RBC / HPF: NONE SEEN (ref 0–?)
Specific Gravity, Urine: >=1.03 — AB (ref 1.000–1.030)
Total Protein, Urine: NEGATIVE
URINE GLUCOSE: NEGATIVE
Urobilinogen, UA: 0.2 (ref 0.0–1.0)
pH: 5.5 (ref 5.0–8.0)

## 2018-07-11 LAB — COMPREHENSIVE METABOLIC PANEL
ALK PHOS: 51 U/L (ref 39–117)
ALT: 49 U/L (ref 0–53)
AST: 40 U/L — ABNORMAL HIGH (ref 0–37)
Albumin: 4.9 g/dL (ref 3.5–5.2)
BILIRUBIN TOTAL: 1 mg/dL (ref 0.2–1.2)
BUN: 9 mg/dL (ref 6–23)
CO2: 30 mEq/L (ref 19–32)
Calcium: 9.8 mg/dL (ref 8.4–10.5)
Chloride: 101 mEq/L (ref 96–112)
Creatinine, Ser: 0.98 mg/dL (ref 0.40–1.50)
GFR: 97.65 mL/min (ref >60.00)
Glucose, Bld: 94 mg/dL (ref 70–99)
Potassium: 3.7 mEq/L (ref 3.5–5.1)
Sodium: 140 mEq/L (ref 135–145)
Total Protein: 7.4 g/dL (ref 6.0–8.3)

## 2018-07-11 LAB — LIPID PANEL
Cholesterol: 184 mg/dL (ref 0–200)
HDL: 55.3 mg/dL (ref >39.00)
NonHDL: 128.28
TRIGLYCERIDES: 319 mg/dL — AB (ref 0.0–149.0)
Total CHOL/HDL Ratio: 3
VLDL: 63.8 mg/dL — ABNORMAL HIGH (ref 0.0–40.0)

## 2018-07-11 LAB — CBC WITH DIFFERENTIAL/PLATELET
Basophils Absolute: 0.1 10*3/uL (ref 0.0–0.1)
Basophils Relative: 0.7 % (ref 0.0–3.0)
Eosinophils Absolute: 0 10*3/uL (ref 0.0–0.7)
Eosinophils Relative: 0.3 % (ref 0.0–5.0)
HCT: 42 % (ref 39.0–52.0)
Hemoglobin: 14.7 g/dL (ref 13.0–17.0)
Lymphocytes Relative: 21.8 % (ref 12.0–46.0)
Lymphs Abs: 2 10*3/uL (ref 0.7–4.0)
MCHC: 35 g/dL (ref 30.0–36.0)
MCV: 90.2 fl (ref 78.0–100.0)
Monocytes Absolute: 0.6 10*3/uL (ref 0.1–1.0)
Monocytes Relative: 6.4 % (ref 3.0–12.0)
NEUTROS PCT: 70.8 % (ref 43.0–77.0)
Neutro Abs: 6.5 10*3/uL (ref 1.4–7.7)
Platelets: 195 10*3/uL (ref 150.0–400.0)
RBC: 4.66 Mil/uL (ref 4.22–5.81)
RDW: 13.3 % (ref 11.5–15.5)
WBC: 9.2 10*3/uL (ref 4.0–10.5)

## 2018-07-11 LAB — TSH: TSH: 2.28 u[IU]/mL (ref 0.35–4.50)

## 2018-07-11 LAB — LDL CHOLESTEROL, DIRECT: Direct LDL: 75 mg/dL

## 2018-07-11 LAB — PSA: PSA: 0.41 ng/mL (ref 0.10–4.00)

## 2018-07-11 NOTE — Progress Notes (Signed)
Subjective:  Patient ID: Philip Zimmerman, male    DOB: July 07, 1965  Age: 53 y.o. MRN: [id number]  CC: Annual Exam; Hypertension; and Hyperlipidemia   HPI Philip Zimmerman presents for a CPX.  Philip Zimmerman is not taking lovaza to lower his trigs. Philip Zimmerman has 4 EtOHic drinks per day. Philip Zimmerman feels well today.  Outpatient Medications Prior to Visit  Medication Sig Dispense Refill  . omega-3 acid ethyl esters (LOVAZA) 1 g capsule Take 2 capsules (2 g total) by mouth 2 (two) times daily. 360 capsule 1   No facility-administered medications prior to visit.     ROS Review of Systems  Constitutional: Negative.  Negative for diaphoresis, fatigue and unexpected weight change.  HENT: Negative.  Negative for trouble swallowing.   Eyes: Negative for visual disturbance.  Respiratory: Negative for cough, chest tightness, shortness of breath and wheezing.   Cardiovascular: Negative for chest pain, palpitations and leg swelling.  Gastrointestinal: Negative for abdominal pain, constipation, diarrhea and nausea.  Endocrine: Negative.   Genitourinary: Negative.  Negative for dysuria, penile swelling, scrotal swelling and testicular pain.  Musculoskeletal: Negative.  Negative for arthralgias, myalgias and neck pain.  Skin: Negative.  Negative for color change, pallor and rash.  Neurological: Negative.  Negative for dizziness, weakness, light-headedness and headaches.  Hematological: Negative for adenopathy. Does not bruise/bleed easily.  Psychiatric/Behavioral: Negative.     Objective:  BP 138/82 (BP Location: Left Arm, Patient Position: Sitting, Cuff Size: Normal)   Pulse 90   Temp 98.4 F (36.9 C) (Oral)   Resp 16   Ht 6\' 2"  (1.88 m)   Wt 175 lb (79.4 kg)   SpO2 98%   BMI 22.47 kg/m   BP Readings from Last 3 Encounters:  07/11/18 138/82  05/16/17 128/80  01/14/16 118/84    Wt Readings from Last 3 Encounters:  07/11/18 175 lb (79.4 kg)  05/16/17 177 lb (80.3 kg)  01/14/16 182 lb (82.6 kg)     Physical Exam Vitals signs reviewed.  Constitutional:      Appearance: Philip Zimmerman is not ill-appearing or diaphoretic.  HENT:     Nose: Nose normal. No congestion or rhinorrhea.     Mouth/Throat:     Mouth: Mucous membranes are moist.     Pharynx: Oropharynx is clear. No oropharyngeal exudate or posterior oropharyngeal erythema.  Eyes:     Conjunctiva/sclera: Conjunctivae normal.  Neck:     Musculoskeletal: Normal range of motion and neck supple.  Cardiovascular:     Rate and Rhythm: Normal rate and regular rhythm.     Heart sounds: No murmur. No gallop.   Pulmonary:     Effort: Pulmonary effort is normal.     Breath sounds: No stridor. No wheezing, rhonchi or rales.  Abdominal:     General: Abdomen is flat. Bowel sounds are normal.     Palpations: There is no mass.     Tenderness: There is no abdominal tenderness.     Hernia: There is no hernia in the right inguinal area or left inguinal area.  Genitourinary:    Pubic Area: No rash.      Penis: Normal. No discharge, swelling or lesions.      Scrotum/Testes: Normal.        Right: Mass, tenderness or swelling not present.        Left: Mass, tenderness or swelling not present.     Epididymis:     Right: Normal. Not inflamed or enlarged. No mass.     Left: Normal. Not  inflamed or enlarged. No mass.     Prostate: Normal. Not enlarged, not tender and no nodules present.     Rectum: Normal. Guaiac result negative. No mass, tenderness, anal fissure, external hemorrhoid or internal hemorrhoid. Normal anal tone.  Musculoskeletal: Normal range of motion.        General: No swelling.     Right lower leg: No edema.     Left lower leg: No edema.  Lymphadenopathy:     Cervical: No cervical adenopathy.     Lower Body: No right inguinal adenopathy. No left inguinal adenopathy.  Skin:    General: Skin is warm and dry.     Coloration: Skin is not pale.     Findings: No erythema or rash.  Neurological:     General: No focal deficit present.      Mental Status: Philip Zimmerman is oriented to person, place, and time. Mental status is at baseline.  Psychiatric:        Mood and Affect: Mood normal.        Behavior: Behavior normal.        Thought Content: Thought content normal.        Judgment: Judgment normal.     Lab Results  Component Value Date   WBC 9.2 07/11/2018   HGB 14.7 07/11/2018   HCT 42.0 07/11/2018   PLT 195.0 07/11/2018   GLUCOSE 94 07/11/2018   CHOL 184 07/11/2018   TRIG 319.0 (H) 07/11/2018   HDL 55.30 07/11/2018   LDLDIRECT 75.0 07/11/2018   LDLCALC 67 08/21/2013   ALT 49 07/11/2018   AST 40 (H) 07/11/2018   NA 140 07/11/2018   K 3.7 07/11/2018   CL 101 07/11/2018   CREATININE 0.98 07/11/2018   BUN 9 07/11/2018   CO2 30 07/11/2018   TSH 2.28 07/11/2018   PSA 0.41 07/11/2018    Dg Lumbar Spine Complete  Result Date: 08/04/2014 CLINICAL DATA:  Five-day history of low back pain without known injury; there are left-sided sciatic symptoms. EXAM: LUMBAR SPINE - COMPLETE 4+ VIEW COMPARISON:  AP view of the lumbar spine and abdomen of March 18, 2014 FINDINGS: The lumbar vertebral bodies are preserved in height. There is mild loss of the normal lumbar lordosis. There is grade 1 retrolisthesis of L5 with respect to S1 and L4 with respect to L5. There is mild narrowing of the L4-5 disc space. The pedicles and transverse processes are intact. IMPRESSION: There is mild degenerative disc and facet joint change in the lower lumbar spine. There is no compression fracture nor high-grade disc space narrowing. Electronically Signed   By: David  [place]   On: 08/04/2014 12:20    Assessment & Plan:   Philip Zimmerman was seen today for annual exam, hypertension and hyperlipidemia.  Diagnoses and all orders for this visit:  Routine general medical examination at a health care facility- Exam completed, labs reviewed, Philip Zimmerman refused a flu vax, cologuard ordered to screen for colon cancer/polyps, pt ed material was given. -     Lipid panel;  Future -     PSA; Future  Hypertriglyceridemia- In addition to lifestyle changes I have asked him to start vascepa to reduce the risk of complications like pancreatitis. -     Icosapent Ethyl (VASCEPA) 1 g CAPS; Take 2 capsules (2 g total) by mouth 2 (two) times daily.  Gastroesophageal reflux disease without esophagitis- No complications noted -     CBC with Differential/Platelet; Future  Essential hypertension- Philip Zimmerman has stage I HTN. His  labs  are neg for secondary causes or end organ damage, Philip Zimmerman is not willing to start an antihypertensive, will recheck his BP in 6 months. -     Comprehensive metabolic panel; Future -     CBC with Differential/Platelet; Future -     TSH; Future -     Urinalysis, Routine w reflex microscopic; Future  Colon cancer screening -     Cologuard  Elevated LFTs- I am concerned that this may be related to the EtOH. Will check a GGT to confirm. Will screen for viral hepatitis as well. -     Gamma GT; Future -     Hepatitis A antibody, total; Future -     Hepatitis B core antibody, total; Future -     Hepatitis B surface antibody,qualitative; Future -     Hepatitis C antibody; Future   I have discontinued Navin Graig's omega-3 acid ethyl esters. I am also having him start on Icosapent Ethyl.  Meds ordered this encounter  Medications  . Icosapent Ethyl (VASCEPA) 1 g CAPS    Sig: Take 2 capsules (2 g total) by mouth 2 (two) times daily.    Dispense:  360 capsule    Refill:  1     Follow-up: Return in about 6 months (around 01/11/2019).  Sanda Linger, MD

## 2018-07-11 NOTE — Patient Instructions (Signed)

## 2018-07-12 ENCOUNTER — Encounter: Payer: Self-pay | Admitting: Internal Medicine

## 2018-07-12 DIAGNOSIS — R945 Abnormal results of liver function studies: Secondary | ICD-10-CM

## 2018-07-12 DIAGNOSIS — R7989 Other specified abnormal findings of blood chemistry: Secondary | ICD-10-CM | POA: Insufficient documentation

## 2018-07-12 MED ORDER — ICOSAPENT ETHYL 1 G PO CAPS: 2.0000 | ORAL_CAPSULE | Freq: Two times a day (BID) | ORAL | 1 refills | Status: DC

## 2019-05-06 ENCOUNTER — Encounter: Payer: Self-pay | Admitting: Internal Medicine

## 2019-05-06 ENCOUNTER — Other Ambulatory Visit: Payer: Self-pay | Admitting: Internal Medicine

## 2019-05-06 ENCOUNTER — Ambulatory Visit (INDEPENDENT_AMBULATORY_CARE_PROVIDER_SITE_OTHER): Payer: No Typology Code available for payment source | Admitting: Internal Medicine

## 2019-05-06 ENCOUNTER — Other Ambulatory Visit: Payer: Self-pay

## 2019-05-06 ENCOUNTER — Ambulatory Visit (INDEPENDENT_AMBULATORY_CARE_PROVIDER_SITE_OTHER)
Admission: RE | Admit: 2019-05-06 | Discharge: 2019-05-06 | Disposition: A | Discharge status: Home / Self Care | Payer: No Typology Code available for payment source | Source: Ambulatory Visit | Attending: Internal Medicine | Admitting: Internal Medicine

## 2019-05-06 ENCOUNTER — Ambulatory Visit: Payer: Self-pay | Admitting: *Deleted

## 2019-05-06 VITALS — BP 136/84 | HR 80 | Temp 98.2°F | Resp 16 | Ht 74.0 in | Wt 174.0 lb

## 2019-05-06 DIAGNOSIS — R109 Unspecified abdominal pain: Secondary | ICD-10-CM

## 2019-05-06 DIAGNOSIS — Z1211 Encounter for screening for malignant neoplasm of colon: Secondary | ICD-10-CM

## 2019-05-06 DIAGNOSIS — R1032 Left lower quadrant pain: Secondary | ICD-10-CM

## 2019-05-06 DIAGNOSIS — E781 Pure hyperglyceridemia: Secondary | ICD-10-CM | POA: Diagnosis not present

## 2019-05-06 DIAGNOSIS — I10 Essential (primary) hypertension: Secondary | ICD-10-CM | POA: Diagnosis not present

## 2019-05-06 DIAGNOSIS — R7989 Other specified abnormal findings of blood chemistry: Secondary | ICD-10-CM

## 2019-05-06 LAB — CBC WITH DIFFERENTIAL/PLATELET
Basophils Absolute: 0 10*3/uL (ref 0.0–0.1)
Basophils Relative: 0.3 % (ref 0.0–3.0)
Eosinophils Absolute: 0 10*3/uL (ref 0.0–0.7)
Eosinophils Relative: 0.4 % (ref 0.0–5.0)
HCT: 46.4 % (ref 39.0–52.0)
Hemoglobin: 15.8 g/dL (ref 13.0–17.0)
Lymphocytes Relative: 21.8 % (ref 12.0–46.0)
Lymphs Abs: 2.3 10*3/uL (ref 0.7–4.0)
MCHC: 34.1 g/dL (ref 30.0–36.0)
MCV: 91.6 fl (ref 78.0–100.0)
Monocytes Absolute: 0.6 10*3/uL (ref 0.1–1.0)
Monocytes Relative: 5.6 % (ref 3.0–12.0)
Neutro Abs: 7.5 10*3/uL (ref 1.4–7.7)
Neutrophils Relative %: 71.9 % (ref 43.0–77.0)
Platelets: 209 10*3/uL (ref 150.0–400.0)
RBC: 5.07 Mil/uL (ref 4.22–5.81)
RDW: 13.2 % (ref 11.5–15.5)
WBC: 10.5 10*3/uL (ref 4.0–10.5)

## 2019-05-06 LAB — POC URINALSYSI DIPSTICK (AUTOMATED)
Bilirubin, UA: NEGATIVE
Blood, UA: NEGATIVE
Glucose, UA: NEGATIVE
Ketones, UA: NEGATIVE
Leukocytes, UA: NEGATIVE
Nitrite, UA: NEGATIVE
Protein, UA: POSITIVE — AB
Spec Grav, UA: >=1.03 — AB (ref 1.010–1.025)
Urobilinogen, UA: 0.2 E.U./dL
pH, UA: 6 (ref 5.0–8.0)

## 2019-05-06 LAB — HEPATIC FUNCTION PANEL
ALT: 61 U/L — ABNORMAL HIGH (ref 0–53)
AST: 62 U/L — ABNORMAL HIGH (ref 0–37)
Albumin: 4.8 g/dL (ref 3.5–5.2)
Alkaline Phosphatase: 52 U/L (ref 39–117)
Bilirubin, Direct: 0.2 mg/dL (ref 0.0–0.3)
Total Bilirubin: 1.3 mg/dL — ABNORMAL HIGH (ref 0.2–1.2)
Total Protein: 7.7 g/dL (ref 6.0–8.3)

## 2019-05-06 LAB — PROTIME-INR
INR: 1.1 ratio — ABNORMAL HIGH (ref 0.8–1.0)
Prothrombin Time: 12.7 s (ref 9.6–13.1)

## 2019-05-06 LAB — TRIGLYCERIDES: Triglycerides: 643 mg/dL — ABNORMAL HIGH (ref 0.0–149.0)

## 2019-05-06 MED ORDER — ICOSAPENT ETHYL 1 G PO CAPS: 2.0000 g | ORAL_CAPSULE | Freq: Two times a day (BID) | ORAL | 1 refills | Status: DC

## 2019-05-06 NOTE — Patient Instructions (Signed)
Flank Pain, Adult Flank pain is pain that is located on the side of the body between the upper abdomen and the back. This area is called the flank. The pain may occur over a short period of time (acute), or it may be long-term or recurring (chronic). It may be mild or severe. Flank pain can be caused by many things, including:  Muscle soreness or injury.  Kidney stones or kidney disease.  Stress.  A disease of the spine (vertebral disk disease).  A lung infection (pneumonia).  Fluid around the lungs (pulmonary edema).  A skin rash caused by the chickenpox virus (shingles).  Tumors that affect the back of the abdomen.  Gallbladder disease. Follow these instructions at home:   Drink enough fluid to keep your urine clear or pale yellow.  Rest as told by your health care provider.  Take over-the-counter and prescription medicines only as told by your health care provider.  Keep a journal to track what has caused your flank pain and what has made it feel better.  Keep all follow-up visits as told by your health care provider. This is important. Contact a health care provider if:  Your pain is not controlled with medicine.  You have new symptoms.  Your pain gets worse.  You have a fever.  Your symptoms last longer than 2-3 days.  You have trouble urinating or you are urinating very frequently. Get help right away if:  You have trouble breathing or you are short of breath.  Your abdomen hurts or it is swollen or red.  You have nausea or vomiting.  You feel faint or you pass out.  You have blood in your urine. Summary  Flank pain is pain that is located on the side of the body between the upper abdomen and the back.  The pain may occur over a short period of time (acute), or it may be long-term or recurring (chronic). It may be mild or severe.  Flank pain can be caused by many things.  Contact your health care provider if your symptoms get worse or they last  longer than 2-3 days. This information is not intended to replace advice given to you by your health care provider. Make sure you discuss any questions you have with your health care provider. Document Released: 06/16/2005 Document Revised: 04/07/2017 Document Reviewed: 07/08/2016 Elsevier Patient Education  2020 [employer]  

## 2019-05-06 NOTE — Telephone Encounter (Signed)
He called in c/o having pain below his left rib cage about a hand breath below the left rib cage that is radiating into his back.   "This has been going on for a very long time but this past Sat. And last night it is worse".  See triage notes.  I went over the care advice.   He verbalized understanding.  I warm transferred his call into Dr. Ronnald Ramp' office to [facility] to be scheduled.  I sent my notes to the office.    Reason for Disposition . [1] MILD pain (e.g., does not interfere with normal activities) AND [2] pain comes and goes (cramps) [3] present > 48 hours  Answer Assessment - Initial Assessment Questions 1. LOCATION: "Where does it hurt?"      I'm having pain below my left ribcage going into my back.   Last night it started.     2. RADIATION: "Does the pain shoot anywhere else?" (e.g., chest, back)     To my back 3. ONSET: "When did the pain begin?" (Minutes, hours or days ago)      2 weeks ago it happened then went away.   Now last night I could not sleep because of it.    I play golf and it doesn't limit my movement. 4. SUDDEN: "Gradual or sudden onset?"     It's always there.   I've gotten used to it.   Saturday night it started again.   I ate some soup and crackers but I feel so bloated.    I've been up most of the night hurting. 5. PATTERN "Does the pain come and go, or is it constant?"    - If constant: "Is it getting better, staying the same, or worsening?"      (Note: Constant means the pain never goes away completely; most serious pain is constant and it progresses)     - If intermittent: "How long does it last?" "Do you have pain now?"     (Note: Intermittent means the pain goes away completely between bouts)     It's always there.    6. SEVERITY: "How bad is the pain?"  (e.g., Scale 1-10; mild, moderate, or severe)    - MILD (1-3): doesn't interfere with normal activities, abdomen soft and not tender to touch     - MODERATE (4-7): interferes with normal activities or  awakens from sleep, tender to touch     - SEVERE (8-10): excruciating pain, doubled over, unable to do any normal activities       Moderate.   It fluctuates. 7. RECURRENT SYMPTOM: "Have you ever had this type of abdominal pain before?" If so, ask: "When was the last time?" and "What happened that time?"      I always mention this pain at my yearly physicals. My stools are very soft but not diarrhea.    8. CAUSE: "What do you think is causing the abdominal pain?"     Maybe IBS, I don't know 9. RELIEVING/AGGRAVATING FACTORS: "What makes it better or worse?" (e.g., movement, antacids, bowel movement)     The pain I'm having today it will subside eventually.    It feels better if I hold my stomach in.    I used to take a lot of Tums but they don't help.    10. OTHER SYMPTOMS: "Has there been any vomiting, diarrhea, constipation, or urine problems?"       No nausea or vomiting.  No constipation or diarrhea.  No urinary symptoms.  Protocols used: ABDOMINAL PAIN - MALE-A-AH

## 2019-05-06 NOTE — Progress Notes (Signed)
Subjective:  Patient ID: Philip Zimmerman, male    DOB: [birth year]  Age: 53 y.o. MRN: [id number]  CC: Hypertension, Hyperlipidemia, and Abdominal Pain  This visit occurred during the SARS-CoV-2 public health emergency.  Safety protocols were in place, including screening questions prior to the visit, additional usage of staff PPE, and extensive cleaning of exam room while observing appropriate contact time as indicated for disinfecting solutions.   HPI Philip Zimmerman presents for f/up - He complains of a 2 to 3-day history of left flank pain that he describes as an achy sensation that radiates posteriorly.  He has had intermittent episodes of abdominal pain for at least 3 years that has previously been attributed to IBS.  He tells me that his bowel movements are usually loose and he notices mucus in the stool but he denies diarrhea, constipation, bright red blood per rectum, or melena.  He has taken ibuprofen which has controlled the pain.  He plays golf and does not experience CP, DOE, palpitations, edema, or fatigue. He has not taken vascepa for a month.  Outpatient Medications Prior to Visit  Medication Sig Dispense Refill  . Icosapent Ethyl (VASCEPA) 1 g CAPS Take 2 capsules (2 g total) by mouth 2 (two) times daily. 360 capsule 1   No facility-administered medications prior to visit.    ROS Review of Systems  Constitutional: Negative.  Negative for diaphoresis, fatigue and unexpected weight change.  HENT: Negative.  Negative for sore throat, trouble swallowing and voice change.   Eyes: Negative.   Respiratory: Negative.  Negative for cough, chest tightness, shortness of breath and wheezing.   Cardiovascular: Negative for chest pain, palpitations and leg swelling.  Gastrointestinal: Negative for abdominal pain, blood in stool, constipation, diarrhea, nausea and vomiting.  Endocrine: Negative.   Genitourinary: Positive for flank pain. Negative for difficulty urinating, dysuria,  frequency, hematuria, scrotal swelling, testicular pain and urgency.  Musculoskeletal: Negative for back pain, myalgias and neck pain.  Skin: Negative for color change and rash.  Neurological: Negative.  Negative for dizziness, weakness, light-headedness and headaches.  Hematological: Negative for adenopathy. Does not bruise/bleed easily.  Psychiatric/Behavioral: Negative.     Objective:  BP 136/84 (BP Location: Left Arm, Patient Position: Sitting, Cuff Size: Normal)   Pulse 80   Temp 98.2 F (36.8 C) (Oral)   Resp 16   Ht 6\' 2"  (1.88 m)   Wt 174 lb (78.9 kg)   SpO2 99%   BMI 22.34 kg/m   BP Readings from Last 3 Encounters:  05/06/19 136/84  07/11/18 138/82  05/16/17 128/80    Wt Readings from Last 3 Encounters:  05/06/19 174 lb (78.9 kg)  07/11/18 175 lb (79.4 kg)  05/16/17 177 lb (80.3 kg)    Physical Exam Vitals reviewed.  Constitutional:      General: He is not in acute distress.    Appearance: He is well-developed. He is not ill-appearing, toxic-appearing or diaphoretic.  HENT:     Mouth/Throat:     Mouth: Mucous membranes are moist.  Eyes:     General: No scleral icterus. Cardiovascular:     Rate and Rhythm: Normal rate and regular rhythm.     Heart sounds: No murmur.  Pulmonary:     Effort: Pulmonary effort is normal.     Breath sounds: No stridor. No wheezing, rhonchi or rales.  Abdominal:     General: Abdomen is flat. Bowel sounds are normal. There is no distension.     Palpations: Abdomen is soft.  There is no hepatomegaly, splenomegaly or mass.     Tenderness: There is no abdominal tenderness. There is no right CVA tenderness, left CVA tenderness, guarding or rebound.     Hernia: No hernia is present.  Genitourinary:    Pubic Area: No rash.      Prostate: Normal. Not enlarged, not tender and no nodules present.     Rectum: Normal. Guaiac result negative. No mass, tenderness, anal fissure, external hemorrhoid or internal hemorrhoid. Normal anal tone.    Musculoskeletal:        General: Normal range of motion.  Skin:    General: Skin is warm and dry.     Findings: No rash.  Neurological:     General: No focal deficit present.     Mental Status: He is alert.  Psychiatric:        Mood and Affect: Mood normal.        Behavior: Behavior normal.     Lab Results  Component Value Date   WBC 10.5 05/06/2019   HGB 15.8 05/06/2019   HCT 46.4 05/06/2019   PLT 209.0 05/06/2019   GLUCOSE 94 07/11/2018   CHOL 184 07/11/2018   TRIG 643.0 (H) 05/06/2019   HDL 55.30 07/11/2018   LDLDIRECT 75.0 07/11/2018   LDLCALC 67 08/21/2013   ALT 61 (H) 05/06/2019   AST 62 (H) 05/06/2019   NA 140 07/11/2018   K 3.7 07/11/2018   CL 101 07/11/2018   CREATININE 0.98 07/11/2018   BUN 9 07/11/2018   CO2 30 07/11/2018   TSH 2.28 07/11/2018   PSA 0.41 07/11/2018   INR 1.1 (H) 05/06/2019    DG Lumbar Spine Complete  Result Date: 08/04/2014 CLINICAL DATA:  Five-day history of low back pain without known injury; there are left-sided sciatic symptoms. EXAM: LUMBAR SPINE - COMPLETE 4+ VIEW COMPARISON:  AP view of the lumbar spine and abdomen of March 18, 2014 FINDINGS: The lumbar vertebral bodies are preserved in height. There is mild loss of the normal lumbar lordosis. There is grade 1 retrolisthesis of L5 with respect to S1 and L4 with respect to L5. There is mild narrowing of the L4-5 disc space. The pedicles and transverse processes are intact. IMPRESSION: There is mild degenerative disc and facet joint change in the lower lumbar spine. There is no compression fracture nor high-grade disc space narrowing. Electronically Signed   By: David  [place]   On: 08/04/2014 12:20   DG ABD ACUTE 2+V W 1V CHEST  Result Date: 05/06/2019 CLINICAL DATA:  Left flank pain EXAM: DG ABDOMEN ACUTE W/ 1V CHEST COMPARISON:  03/18/2014 FINDINGS: There is no evidence of dilated bowel loops or free intraperitoneal air. No radiopaque calculi or other significant radiographic  abnormality is seen. Heart size and mediastinal contours are within normal limits. Both lungs are clear. IMPRESSION: Negative abdominal radiographs.  No acute cardiopulmonary disease. Electronically Signed   By: Charlett Nose M.D.   On: 05/06/2019 11:54     Assessment & Plan:   Alonza was seen today for hypertension, hyperlipidemia and abdominal pain.  Diagnoses and all orders for this visit:  Elevated LFTs- His LFTs remain elevated.  This is likely related to his alcohol intake.  His MELD-Na score is 8.  He has left flank pain so will get a CT of the abdomen and pelvis to see if there is a mass in the liver and to screen for fatty liver disease. -     Hepatitis B surface antigen -  Hepatitis A antibody, total -     Hepatitis B core antibody, total -     Hepatitis B surface antibody -     Hepatitis C antibody -     Protime-INR -     Hepatic function panel -     Cancel: US Abdomen Limited RUQ; Future -     CT Abdomen Pelvis W Contrast; Future  Hypertriglyceridemia- He agrees to restart Vascepa and to improve his lifestyle modifications. -     Triglycerides; Future -     Triglycerides -     icosapent Ethyl (VASCEPA) 1 g capsule; Take 2 capsules (2 g total) by mouth 2 (two) times daily.  Essential hypertension- His blood pressure is adequately well controlled.  Colon cancer screening -     Ambulatory referral to Gastroenterology  Acute left flank pain- Urinalysis and labs are reassuring.  I have asked him to undergo a CT scan of the abdomen to see if there is a stone, renal lesion, or abscess. -     CBC with Differential -     Cancel: DG Abd Acute W/Chest; Future -     POCT Urinalysis Dipstick (Automated) -     CT Abdomen Pelvis W Contrast; Future  Left lower quadrant abdominal pain- He will undergo CT with contrast to see if there is evidence of pancreatitis, mass, or diverticulitis. -     CT Abdomen Pelvis W Contrast; Future   I have changed Quillan Fugere's icosapent  Ethyl.  Meds ordered this encounter  Medications  . icosapent Ethyl (VASCEPA) 1 g capsule    Sig: Take 2 capsules (2 g total) by mouth 2 (two) times daily.    Dispense:  360 capsule    Refill:  1     Follow-up: Return in about 3 weeks (around 05/27/2019).  Sanda Lingerhomas Akyra Bouchie, MD

## 2019-05-07 ENCOUNTER — Encounter: Payer: Self-pay | Admitting: Internal Medicine

## 2019-05-07 LAB — HEPATITIS C ANTIBODY
Hepatitis C Ab: NONREACTIVE
SIGNAL TO CUT-OFF: 0.01 (ref <1.00)

## 2019-05-07 LAB — HEPATITIS B CORE ANTIBODY, TOTAL: Hep B Core Total Ab: NONREACTIVE

## 2019-05-07 LAB — HEPATITIS B SURFACE ANTIBODY,QUALITATIVE: Hep B S Ab: REACTIVE — AB

## 2019-05-07 LAB — HEPATITIS B SURFACE ANTIGEN: Hepatitis B Surface Ag: NONREACTIVE

## 2019-05-07 LAB — HEPATITIS A ANTIBODY, TOTAL: Hepatitis A AB,Total: NONREACTIVE

## 2019-05-08 ENCOUNTER — Other Ambulatory Visit: Payer: Self-pay | Admitting: Internal Medicine

## 2019-05-08 DIAGNOSIS — I10 Essential (primary) hypertension: Secondary | ICD-10-CM

## 2019-05-13 ENCOUNTER — Other Ambulatory Visit: Payer: No Typology Code available for payment source

## 2019-05-13 ENCOUNTER — Other Ambulatory Visit: Payer: Self-pay

## 2019-05-13 ENCOUNTER — Ambulatory Visit (INDEPENDENT_AMBULATORY_CARE_PROVIDER_SITE_OTHER): Payer: BC Managed Care – PPO

## 2019-05-13 DIAGNOSIS — Z23 Encounter for immunization: Secondary | ICD-10-CM | POA: Diagnosis not present

## 2019-05-13 DIAGNOSIS — Z299 Encounter for prophylactic measures, unspecified: Secondary | ICD-10-CM

## 2019-05-14 ENCOUNTER — Inpatient Hospital Stay: Admission: RE | Admit: 2019-05-14 | Payer: No Typology Code available for payment source | Source: Ambulatory Visit

## 2019-05-27 ENCOUNTER — Encounter: Payer: Self-pay | Admitting: Family Medicine

## 2019-10-14 ENCOUNTER — Ambulatory Visit: Payer: BC Managed Care – PPO

## 2020-04-09 ENCOUNTER — Telehealth: Payer: Self-pay | Admitting: Internal Medicine

## 2020-04-09 NOTE — Telephone Encounter (Signed)
Pt's last ov was 12/28. Ok to provide a letter for his employer? Do you want him to schedule a virtual visit with one of our providers that offer those?

## 2020-04-09 NOTE — Telephone Encounter (Signed)
I have provided the contact information for the [facility] to the patient to call and make an OV.

## 2020-04-09 NOTE — Telephone Encounter (Signed)
    Patient calling to report he had a positive  COVID test on 11/30. He would like to know if he needs to get additional testing  He has a cough and some fatigue He is also requesting a letter to provide his employer

## 2020-04-13 ENCOUNTER — Ambulatory Visit: Payer: BC Managed Care – PPO

## 2020-07-01 ENCOUNTER — Encounter: Payer: Self-pay | Admitting: Internal Medicine

## 2020-07-03 ENCOUNTER — Encounter: Payer: Self-pay | Admitting: Internal Medicine

## 2020-07-03 ENCOUNTER — Ambulatory Visit (INDEPENDENT_AMBULATORY_CARE_PROVIDER_SITE_OTHER): Payer: Self-pay | Admitting: Internal Medicine

## 2020-07-03 ENCOUNTER — Other Ambulatory Visit: Payer: Self-pay

## 2020-07-03 ENCOUNTER — Ambulatory Visit (INDEPENDENT_AMBULATORY_CARE_PROVIDER_SITE_OTHER): Payer: Self-pay

## 2020-07-03 VITALS — BP 118/76 | HR 106 | Temp 98.4°F | Ht 74.0 in | Wt 175.0 lb

## 2020-07-03 DIAGNOSIS — M25561 Pain in right knee: Secondary | ICD-10-CM | POA: Insufficient documentation

## 2020-07-03 DIAGNOSIS — M25461 Effusion, right knee: Secondary | ICD-10-CM

## 2020-07-03 MED ORDER — PREDNISONE 50 MG PO TABS: 50.0000 mg | ORAL_TABLET | Freq: Every day | ORAL | 0 refills | Status: DC

## 2020-07-03 NOTE — Patient Instructions (Signed)
Have an xray downstairs.   Take the prednisone 50 mg daily with food.    A referral was ordered for orthopedics.  They will call you.

## 2020-07-03 NOTE — Progress Notes (Signed)
Subjective:    Patient ID: Philip Zimmerman, male    DOB: [birth year], 55 y.o.   MRN: [id number]  HPI The patient is here for an acute visit.  Right knee pain: His right knee pain started 3 weeks ago.  There is no new activity, injury or any obvious cause.  The soreness initially started in the patellar-fibular tendon.  He has pain throughout the knee and in the posterior knee.  The knee is swollen.  In the morning or after sitting for long period of time and he gets up he is hopping on the knee because he is unable to put any pressure on it.  He feels like the knee is going to give out on him.  He does not exercise regularly.  He does a lot of driving for work and wondered if it was related to driving.  Last week he did try Celebrex that a friend have given him and it did help.  Since the pain started there has been no change in the swelling.  He denies any redness.  He denies any other joint pain.  He denies any fever.  He denies prior knee injuries.  Medications and allergies reviewed with patient and updated if appropriate.  Patient Active Problem List   Diagnosis Date Noted  . Acute left flank pain 05/06/2019  . Colon cancer screening 05/06/2019  . Elevated LFTs 07/12/2018  . Essential hypertension 07/11/2018  . IBS (irritable bowel syndrome) 06/30/2015  . Hypertriglyceridemia 05/23/2011  . Routine general medical examination at a health care facility 01/13/2011  . ALLERGIC RHINITIS 12/05/2007  . GERD 12/05/2007    Current Outpatient Medications on File Prior to Visit  Medication Sig Dispense Refill  . icosapent Ethyl (VASCEPA) 1 g capsule Take 2 capsules (2 g total) by mouth 2 (two) times daily. 360 capsule 1   No current facility-administered medications on file prior to visit.    Past Medical History:  Diagnosis Date  . ALLERGIC RHINITIS 12/05/2007  . GERD 12/05/2007  . HYPERLIPIDEMIA 12/05/2007  . Hypertriglyceridemia 05/23/2011  . TRANSAMINASES, SERUM, ELEVATED  03/08/2010  . Unspecified disorder of kidney and ureter 03/12/2010    No past surgical history on file.  Social History   Socioeconomic History  . Marital status: Divorced    Spouse name: Not on file  . Number of children: Not on file  . Years of education: Not on file  . Highest education level: Not on file  Occupational History  . Occupation: Production designer, theatre/television/film [employer]  Tobacco Use  . Smoking status: Current Every Day Smoker    Packs/day: 0.50    Years: 25.00    Pack years: 12.50    Types: Cigarettes  . Smokeless tobacco: Never Used  . Tobacco comment: occasional marijuana  Substance and Sexual Activity  . Alcohol use: Yes    Alcohol/week: 28.0 standard drinks    Types: 28 Cans of beer per week  . Drug use: No  . Sexual activity: Not Currently  Other Topics Concern  . Not on file  Social History Narrative  . Not on file   Social Determinants of Health   Financial Resource Strain: Not on file  Food Insecurity: Not on file  Transportation Needs: Not on file  Physical Activity: Not on file  Stress: Not on file  Social Connections: Not on file    Family History  Problem Relation Age of Onset  . Heart disease Father   . Hypertension Father   .  Hyperlipidemia Father   . Gout Father   . Cancer Maternal Grandmother        breast cancer  . Diabetes Maternal Grandmother   . Cancer Maternal Grandfather        throat cancer    Review of Systems Per HPI    Objective:   Vitals:   07/03/20 1332  BP: 118/76  Pulse: (!) 106  Temp: 98.4 F (36.9 C)  SpO2: 98%   BP Readings from Last 3 Encounters:  07/03/20 118/76  05/06/19 136/84  07/11/18 138/82   Wt Readings from Last 3 Encounters:  07/03/20 175 lb (79.4 kg)  05/06/19 174 lb (78.9 kg)  07/11/18 175 lb (79.4 kg)   Body mass index is 22.47 kg/m.   Physical Exam    A right knee exam was performed.   SKIN: intact, no bruising  SWELLING: mild  EFFUSION: yes  WARMTH: Slight warmth  TENDERNESS:  Mild tenderness throughout knee and posteriorly.  No tenderness on patellar-fibular tendon.  No quad tenderness ROM: Slightly decreased extension, full flexion  GAIT: walks with a limp - unable to fully bear weight on right leg  NEUROLOGICAL EXAM: normal sensation  CALF TENDERNESS: no        Assessment & Plan:    Right knee pain and swelling: Acute Occurred 3 weeks ago-no injury or change in activity No improvement in symptoms since it started Experiencing pain, swelling/effusion, slight warmth and difficulty walking He does have an effusion and mild tenderness on exam, slight decreased extension and unable to bear full weight X-ray today Flare of arthritis, gout, inflammatory arthritis Prednisone 50 mg daily x5 days Refer to Ortho   This visit occurred during the SARS-CoV-2 public health emergency.  Safety protocols were in place, including screening questions prior to the visit, additional usage of staff PPE, and extensive cleaning of exam room while observing appropriate contact time as indicated for disinfecting solutions.

## 2020-07-10 ENCOUNTER — Encounter: Payer: Self-pay | Admitting: Orthopaedic Surgery

## 2020-07-10 ENCOUNTER — Ambulatory Visit (INDEPENDENT_AMBULATORY_CARE_PROVIDER_SITE_OTHER): Payer: BC Managed Care – PPO | Admitting: Orthopaedic Surgery

## 2020-07-10 VITALS — Ht 74.0 in | Wt 175.0 lb

## 2020-07-10 DIAGNOSIS — G8929 Other chronic pain: Secondary | ICD-10-CM | POA: Diagnosis not present

## 2020-07-10 DIAGNOSIS — M25561 Pain in right knee: Secondary | ICD-10-CM | POA: Diagnosis not present

## 2020-07-10 MED ORDER — METHYLPREDNISOLONE ACETATE 40 MG/ML IJ SUSP
40.0000 mg | INTRAMUSCULAR | Status: AC | PRN
  Administered 2020-07-10: 40 mg via INTRA_ARTICULAR

## 2020-07-10 MED ORDER — BUPIVACAINE HCL 0.5 % IJ SOLN
2.0000 mL | INTRAMUSCULAR | Status: AC | PRN
  Administered 2020-07-10: 2 mL via INTRA_ARTICULAR

## 2020-07-10 MED ORDER — LIDOCAINE HCL 1 % IJ SOLN
2.0000 mL | INTRAMUSCULAR | Status: AC | PRN
  Administered 2020-07-10: 2 mL

## 2020-07-10 NOTE — Progress Notes (Signed)
Office Visit Note   Patient: Philip Zimmerman           Date of Birth: 03-04-1966           MRN: [id number] Visit Date: 07/10/2020              Requested by: Etta Grandchild, MD [address] [place] [employer],  [place] [address] PCP: Etta Grandchild, MD   Assessment & Plan: Visit Diagnoses:  1. Chronic pain of right knee     Plan: Impression is right knee effusion and pain.  I suspect that this could be a crystalline arthropathy.  25 cc of straw-colored fluid aspirated from the right knee and cortisone was injected.  Compression wrap applied.  We will notify the patient of the results of the fluid.  We will see him back as needed.  Follow-Up Instructions: Return if symptoms worsen or fail to improve.   Orders:  Orders Placed This Encounter  Procedures  . Cell count + diff,  w/ cryst-synvl fld   No orders of the defined types were placed in this encounter.     Procedures: Large Joint Inj: R knee on 07/10/2020 11:08 AM Indications: pain Details: 22 G needle  Arthrogram: No  Medications: 40 mg methylPREDNISolone acetate 40 MG/ML; 2 mL lidocaine 1 %; 2 mL bupivacaine 0.5 % Consent was given by the patient. Patient was prepped and draped in the usual sterile fashion.       Clinical Data: No additional findings.   Subjective: Chief Complaint  Patient presents with  . Right Knee - Pain    Philip Zimmerman is a very pleasant 55 year old gentleman here for evaluation of worsening right knee pain for the last 3 weeks.  He denies any injuries.  Initially it was very swollen but this has improved somewhat.  He has significant start up pain and pain after prolonged immobilization.  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  Denies any previous knee surgeries.  He took a couple doses of Celebrex which helped a little bit.  His PCP gave him prednisone which did not help.  Tylenol has not helped.  Denies a history of gout but there is a family history of gout.  He is limping because of the  pain.   Review of Systems  Constitutional: Negative.   All other systems reviewed and are negative.    Objective: Vital Signs: Ht 6\' 2"  (1.88 m)   Wt 175 lb (79.4 kg)   BMI 22.47 kg/m   Physical Exam Vitals and nursing note reviewed.  Constitutional:      Appearance: He is well-developed and well-nourished.  HENT:     Head: Normocephalic and atraumatic.  Eyes:     Pupils: Pupils are equal, round, and reactive to light.  Pulmonary:     Effort: Pulmonary effort is normal.  Abdominal:     Palpations: Abdomen is soft.  Musculoskeletal:        General: Normal range of motion.     Cervical back: Neck supple.  Skin:    General: Skin is warm.  Neurological:     Mental Status: He is alert and oriented to person, place, and time.  Psychiatric:        Mood and Affect: Mood and affect normal.        Behavior: Behavior normal.        Thought Content: Thought content normal.        Judgment: Judgment normal.     Ortho  Exam Right knee shows a moderate joint effusion.  No evidence of infection or warmth or cellulitis.  Collaterals and cruciates are stable.  No jointline tenderness.  Range of motion slightly decreased secondary to pain and effusion. Specialty Comments:  No specialty comments available.  Imaging: No results found.   PMFS History: Patient Active Problem List   Diagnosis Date Noted  . Acute left flank pain 05/06/2019  . Colon cancer screening 05/06/2019  . Elevated LFTs 07/12/2018  . Essential hypertension 07/11/2018  . IBS (irritable bowel syndrome) 06/30/2015  . Hypertriglyceridemia 05/23/2011  . Routine general medical examination at a health care facility 01/13/2011  . ALLERGIC RHINITIS 12/05/2007  . GERD 12/05/2007   Past Medical History:  Diagnosis Date  . ALLERGIC RHINITIS 12/05/2007  . GERD 12/05/2007  . HYPERLIPIDEMIA 12/05/2007  . Hypertriglyceridemia 05/23/2011  . TRANSAMINASES, SERUM, ELEVATED 03/08/2010  . Unspecified disorder of kidney and  ureter 03/12/2010    Family History  Problem Relation Age of Onset  . Heart disease Father   . Hypertension Father   . Hyperlipidemia Father   . Gout Father   . Cancer Maternal Grandmother        breast cancer  . Diabetes Maternal Grandmother   . Cancer Maternal Grandfather        throat cancer    History reviewed. No pertinent surgical history. Social History   Occupational History  . Occupation: Production designer, theatre/television/film [employer]  Tobacco Use  . Smoking status: Current Every Day Smoker    Packs/day: 0.50    Years: 25.00    Pack years: 12.50    Types: Cigarettes  . Smokeless tobacco: Never Used  . Tobacco comment: occasional marijuana  Substance and Sexual Activity  . Alcohol use: Yes    Alcohol/week: 28.0 standard drinks    Types: 28 Cans of beer per week  . Drug use: No  . Sexual activity: Not Currently

## 2020-07-11 LAB — SYNOVIAL FLUID ANALYSIS, COMPLETE
Basophils, %: 0 %
Eosinophils-Synovial: 0 % (ref 0–2)
Lymphocytes-Synovial Fld: 91 % — ABNORMAL HIGH (ref 0–74)
Monocyte/Macrophage: 1 % (ref 0–69)
Neutrophil, Synovial: 8 % (ref 0–24)
Synoviocytes, %: 0 % (ref 0–15)
WBC, Synovial: 168 cells/uL — ABNORMAL HIGH (ref <150)

## 2020-07-11 LAB — TIQ-NTM

## 2020-07-12 NOTE — Progress Notes (Signed)
Please let him know that the fluid shows inflammation but no gout or pseudogout.  He should come back if he doesn't feel any better from the shot.

## 2020-07-20 ENCOUNTER — Encounter: Payer: Self-pay | Admitting: Physician Assistant

## 2020-07-20 ENCOUNTER — Other Ambulatory Visit: Payer: Self-pay

## 2020-07-20 ENCOUNTER — Ambulatory Visit: Payer: BC Managed Care – PPO | Admitting: Orthopaedic Surgery

## 2020-07-20 DIAGNOSIS — M25561 Pain in right knee: Secondary | ICD-10-CM | POA: Diagnosis not present

## 2020-07-20 DIAGNOSIS — G8929 Other chronic pain: Secondary | ICD-10-CM | POA: Diagnosis not present

## 2020-07-20 MED ORDER — TRAMADOL HCL 50 MG PO TABS: 50.0000 mg | ORAL_TABLET | Freq: Three times a day (TID) | ORAL | 0 refills | Status: DC | PRN

## 2020-07-20 NOTE — Progress Notes (Signed)
Office Visit Note   Patient: Philip Zimmerman           Date of Birth: May 03, 1966           MRN: [id number] Visit Date: 07/20/2020              Requested by: Etta Grandchild, MD [address] [place],  [place] [address] PCP: Etta Grandchild, MD   Assessment & Plan: Visit Diagnoses:  1. Chronic pain of right knee     Plan: Impression is right knee medial meniscus tear.  At this point he is not responded well to cortisone injection I feel it is appropriate to order an MRI to further evaluate for structural abnormalities.  Follow-up with [place] once has been completed.  Follow-Up Instructions: Return for after MRI.   Orders:  No orders of the defined types were placed in this encounter.  No orders of the defined types were placed in this encounter.     Procedures: No procedures performed   Clinical Data: No additional findings.   Subjective: Chief Complaint  Patient presents with  . Right Knee - Pain    HPI patient is a very pleasant 55 year old gentleman who comes in today with recurrent right knee pain for the past month or so.  No known injury or change in activity.  Was seen in our office few weeks ago where her right knee was aspirated and then injected with cortisone.  Joint fluid showed nothing more than inflammation.  He has had continued pain despite having had a cortisone injection.  Majority of his pain is to the medial aspect and is worse with walking.  He does note that he frequently limps due to this.  He has been taking Aleve as needed which minimally improves his symptoms.  Review of Systems as detailed in HPI.  All others reviewed and are negative.   Objective: Vital Signs: There were no vitals taken for this visit.  Physical Exam well-developed well-nourished gentleman in no acute distress.  Alert and oriented x3.  Ortho Exam right knee exam shows a small effusion.  Range of motion 0 to 110 degrees.  Moderate medial joint line tenderness.  Ligaments  are stable.  He is neurovascular intact distally.  Specialty Comments:  No specialty comments available.  Imaging: No new imaging   PMFS History: Patient Active Problem List   Diagnosis Date Noted  . Acute left flank pain 05/06/2019  . Colon cancer screening 05/06/2019  . Elevated LFTs 07/12/2018  . Essential hypertension 07/11/2018  . IBS (irritable bowel syndrome) 06/30/2015  . Hypertriglyceridemia 05/23/2011  . Routine general medical examination at a health care facility 01/13/2011  . ALLERGIC RHINITIS 12/05/2007  . GERD 12/05/2007   Past Medical History:  Diagnosis Date  . ALLERGIC RHINITIS 12/05/2007  . GERD 12/05/2007  . HYPERLIPIDEMIA 12/05/2007  . Hypertriglyceridemia 05/23/2011  . TRANSAMINASES, SERUM, ELEVATED 03/08/2010  . Unspecified disorder of kidney and ureter 03/12/2010    Family History  Problem Relation Age of Onset  . Heart disease Father   . Hypertension Father   . Hyperlipidemia Father   . Gout Father   . Cancer Maternal Grandmother        breast cancer  . Diabetes Maternal Grandmother   . Cancer Maternal Grandfather        throat cancer    History reviewed. No pertinent surgical history. Social History   Occupational History  . Occupation: Production designer, theatre/television/film [employer]  Tobacco Use  .  Smoking status: Current Every Day Smoker    Packs/day: 0.50    Years: 25.00    Pack years: 12.50    Types: Cigarettes  . Smokeless tobacco: Never Used  . Tobacco comment: occasional marijuana  Substance and Sexual Activity  . Alcohol use: Yes    Alcohol/week: 28.0 standard drinks    Types: 28 Cans of beer per week  . Drug use: No  . Sexual activity: Not Currently

## 2020-07-27 ENCOUNTER — Other Ambulatory Visit: Payer: Self-pay

## 2020-07-27 ENCOUNTER — Telehealth: Payer: Self-pay

## 2020-07-27 DIAGNOSIS — G8929 Other chronic pain: Secondary | ICD-10-CM

## 2020-07-27 NOTE — Telephone Encounter (Signed)
Patient called he is requesting a referral to be sent to [facility] per last office visit call [phone]

## 2020-07-27 NOTE — Telephone Encounter (Signed)
MRI ORDER MADE 

## 2020-08-06 ENCOUNTER — Ambulatory Visit (INDEPENDENT_AMBULATORY_CARE_PROVIDER_SITE_OTHER): Payer: BC Managed Care – PPO | Admitting: Internal Medicine

## 2020-08-06 ENCOUNTER — Other Ambulatory Visit: Payer: Self-pay

## 2020-08-06 ENCOUNTER — Encounter: Payer: Self-pay | Admitting: Internal Medicine

## 2020-08-06 VITALS — BP 142/84 | HR 99 | Temp 98.6°F | Resp 16 | Ht 74.0 in | Wt 175.0 lb

## 2020-08-06 DIAGNOSIS — E559 Vitamin D deficiency, unspecified: Secondary | ICD-10-CM

## 2020-08-06 DIAGNOSIS — I10 Essential (primary) hypertension: Secondary | ICD-10-CM | POA: Diagnosis not present

## 2020-08-06 DIAGNOSIS — R7989 Other specified abnormal findings of blood chemistry: Secondary | ICD-10-CM | POA: Diagnosis not present

## 2020-08-06 DIAGNOSIS — Z720 Tobacco use: Secondary | ICD-10-CM | POA: Diagnosis not present

## 2020-08-06 DIAGNOSIS — Z23 Encounter for immunization: Secondary | ICD-10-CM | POA: Diagnosis not present

## 2020-08-06 DIAGNOSIS — Z0000 Encounter for general adult medical examination without abnormal findings: Secondary | ICD-10-CM | POA: Diagnosis not present

## 2020-08-06 DIAGNOSIS — E781 Pure hyperglyceridemia: Secondary | ICD-10-CM | POA: Diagnosis not present

## 2020-08-06 DIAGNOSIS — Z1211 Encounter for screening for malignant neoplasm of colon: Secondary | ICD-10-CM

## 2020-08-06 LAB — CBC WITH DIFFERENTIAL/PLATELET
Basophils Absolute: 0 10*3/uL (ref 0.0–0.1)
Basophils Relative: 0.3 % (ref 0.0–3.0)
Eosinophils Absolute: 0.1 10*3/uL (ref 0.0–0.7)
Eosinophils Relative: 1.1 % (ref 0.0–5.0)
HCT: 41.8 % (ref 39.0–52.0)
Hemoglobin: 14.6 g/dL (ref 13.0–17.0)
Lymphocytes Relative: 29 % (ref 12.0–46.0)
Lymphs Abs: 2.1 10*3/uL (ref 0.7–4.0)
MCHC: 35 g/dL (ref 30.0–36.0)
MCV: 90.5 fl (ref 78.0–100.0)
Monocytes Absolute: 0.4 10*3/uL (ref 0.1–1.0)
Monocytes Relative: 5.6 % (ref 3.0–12.0)
Neutro Abs: 4.7 10*3/uL (ref 1.4–7.7)
Neutrophils Relative %: 64 % (ref 43.0–77.0)
Platelets: 210 10*3/uL (ref 150.0–400.0)
RBC: 4.62 Mil/uL (ref 4.22–5.81)
RDW: 13.3 % (ref 11.5–15.5)
WBC: 7.3 10*3/uL (ref 4.0–10.5)

## 2020-08-06 LAB — URINALYSIS, ROUTINE W REFLEX MICROSCOPIC
Bilirubin Urine: NEGATIVE
Hgb urine dipstick: NEGATIVE
Ketones, ur: NEGATIVE
Leukocytes,Ua: NEGATIVE
Nitrite: NEGATIVE
RBC / HPF: NONE SEEN (ref 0–?)
Specific Gravity, Urine: >=1.03 — AB (ref 1.000–1.030)
Total Protein, Urine: NEGATIVE
Urine Glucose: NEGATIVE
Urobilinogen, UA: 0.2 (ref 0.0–1.0)
pH: 5.5 (ref 5.0–8.0)

## 2020-08-06 LAB — PROTIME-INR
INR: 1.1 ratio — ABNORMAL HIGH (ref 0.8–1.0)
Prothrombin Time: 12.1 s (ref 9.6–13.1)

## 2020-08-06 LAB — HEPATIC FUNCTION PANEL
ALT: 38 U/L (ref 0–53)
AST: 43 U/L — ABNORMAL HIGH (ref 0–37)
Albumin: 4.6 g/dL (ref 3.5–5.2)
Alkaline Phosphatase: 55 U/L (ref 39–117)
Bilirubin, Direct: 0.2 mg/dL (ref 0.0–0.3)
Total Bilirubin: 1.2 mg/dL (ref 0.2–1.2)
Total Protein: 7.1 g/dL (ref 6.0–8.3)

## 2020-08-06 LAB — BASIC METABOLIC PANEL
BUN: 12 mg/dL (ref 6–23)
CO2: 27 mEq/L (ref 19–32)
Calcium: 9.8 mg/dL (ref 8.4–10.5)
Chloride: 101 mEq/L (ref 96–112)
Creatinine, Ser: 0.98 mg/dL (ref 0.40–1.50)
GFR: 88.44 mL/min (ref >60.00)
Glucose, Bld: 93 mg/dL (ref 70–99)
Potassium: 4 mEq/L (ref 3.5–5.1)
Sodium: 138 mEq/L (ref 135–145)

## 2020-08-06 LAB — LIPID PANEL
Cholesterol: 235 mg/dL — ABNORMAL HIGH (ref 0–200)
HDL: 56.7 mg/dL (ref >39.00)
Total CHOL/HDL Ratio: 4
Triglycerides: 894 mg/dL — ABNORMAL HIGH (ref 0.0–149.0)

## 2020-08-06 LAB — PSA: PSA: 0.55 ng/mL (ref 0.10–4.00)

## 2020-08-06 LAB — LDL CHOLESTEROL, DIRECT: Direct LDL: 86 mg/dL

## 2020-08-06 LAB — VITAMIN D 25 HYDROXY (VIT D DEFICIENCY, FRACTURES): VITD: 9.01 ng/mL — ABNORMAL LOW (ref 30.00–100.00)

## 2020-08-06 LAB — TSH: TSH: 1.81 u[IU]/mL (ref 0.35–4.50)

## 2020-08-06 MED ORDER — NEBIVOLOL HCL 5 MG PO TABS: 5.0000 mg | ORAL_TABLET | Freq: Every day | ORAL | 1 refills | Status: DC

## 2020-08-06 MED ORDER — VASCEPA 1 G PO CAPS: 2.0000 g | ORAL_CAPSULE | Freq: Two times a day (BID) | ORAL | 1 refills | Status: DC

## 2020-08-06 NOTE — Progress Notes (Signed)
Subjective:  Patient ID: Philip Ore., male    DOB: 1965/07/01  Age: 55 y.o. MRN: [id number]  CC: Annual Exam and Hypertension  This visit occurred during the SARS-CoV-2 public health emergency.  Safety protocols were in place, including screening questions prior to the visit, additional usage of staff PPE, and extensive cleaning of exam room while observing appropriate contact time as indicated for disinfecting solutions.    HPI Philip Novacek. presents for a CPX and f/up -   He complains of chronic knee pain.  He has a history of high triglycerides but is not taking the fish oil supplement to treat this.  He has chronic, unchanged pain in his left upper abdomen.  He denies nausea, vomiting, dysuria, hematuria, diarrhea, or constipation.  He tells me that he drinks about 3 alcoholic beverages a day and continues to smoke cigarettes.  He is active and denies any recent episodes of chest pain, shortness of breath, cough, wheezing, diaphoresis, dizziness, lightheadedness, or palpitations.  Outpatient Medications Prior to Visit  Medication Sig Dispense Refill  . traMADol (ULTRAM) 50 MG tablet Take 1 tablet (50 mg total) by mouth 3 (three) times daily as needed. 30 tablet 0  . icosapent Ethyl (VASCEPA) 1 g capsule Take 2 capsules (2 g total) by mouth 2 (two) times daily. 360 capsule 1  . predniSONE (DELTASONE) 50 MG tablet Take 1 tablet (50 mg total) by mouth daily with breakfast. 5 tablet 0   No facility-administered medications prior to visit.    ROS Review of Systems  Constitutional: Negative for appetite change, chills, diaphoresis, fatigue and fever.  HENT: Negative.  Negative for trouble swallowing.   Eyes: Negative.   Respiratory: Negative for cough, chest tightness, shortness of breath and wheezing.   Cardiovascular: Negative for chest pain, palpitations and leg swelling.  Gastrointestinal: Positive for abdominal pain. Negative for abdominal distention, blood in stool,  constipation, diarrhea, nausea and vomiting.  Endocrine: Negative.   Genitourinary: Negative.  Negative for difficulty urinating, penile swelling, scrotal swelling and testicular pain.  Musculoskeletal: Positive for arthralgias. Negative for back pain and myalgias.  Skin: Negative.  Negative for color change and pallor.  Neurological: Negative.  Negative for dizziness, weakness, light-headedness and headaches.  Hematological: Negative for adenopathy. Does not bruise/bleed easily.  Psychiatric/Behavioral: Negative.     Objective:  BP (!) 142/84 (BP Location: Left Arm, Patient Position: Sitting, Cuff Size: Large)   Pulse 99   Temp 98.6 F (37 C) (Oral)   Resp 16   Ht 6\' 2"  (1.88 m)   Wt 175 lb (79.4 kg)   SpO2 97%   BMI 22.47 kg/m   BP Readings from Last 3 Encounters:  08/06/20 (!) 142/84  07/03/20 118/76  05/06/19 136/84    Wt Readings from Last 3 Encounters:  08/06/20 175 lb (79.4 kg)  07/10/20 175 lb (79.4 kg)  07/03/20 175 lb (79.4 kg)    Physical Exam Vitals reviewed.  Constitutional:      Appearance: Normal appearance.  HENT:     Nose: Nose normal.     Mouth/Throat:     Mouth: Mucous membranes are moist.  Eyes:     General: No scleral icterus.    Conjunctiva/sclera: Conjunctivae normal.  Cardiovascular:     Rate and Rhythm: Normal rate and regular rhythm.     Heart sounds: No murmur heard.     Comments: EKG- NSR, 97 bpm No LVH Normal EKG Pulmonary:     Effort: Pulmonary effort is normal.  Breath sounds: No stridor. No wheezing, rhonchi or rales.  Abdominal:     General: Abdomen is flat.     Palpations: There is no mass.     Tenderness: There is no abdominal tenderness. There is no guarding.     Hernia: No hernia is present. There is no hernia in the left inguinal area or right inguinal area.  Genitourinary:    Pubic Area: No rash.      Penis: Normal and circumcised.      Testes: Normal.     Epididymis:     Right: Normal.     Left: Normal.      Prostate: Normal. Not enlarged, not tender and no nodules present.     Rectum: Normal. Guaiac result negative. No mass, tenderness, anal fissure, external hemorrhoid or internal hemorrhoid. Normal anal tone.  Musculoskeletal:        General: Normal range of motion.     Cervical back: Neck supple.     Right lower leg: No edema.     Left lower leg: No edema.  Lymphadenopathy:     Cervical: No cervical adenopathy.     Lower Body: No right inguinal adenopathy. No left inguinal adenopathy.  Skin:    General: Skin is warm and dry.     Coloration: Skin is not jaundiced or pale.     Findings: No rash.  Neurological:     General: No focal deficit present.     Mental Status: He is alert.  Psychiatric:        Mood and Affect: Mood normal.        Behavior: Behavior normal.     Lab Results  Component Value Date   WBC 7.3 08/06/2020   HGB 14.6 08/06/2020   HCT 41.8 08/06/2020   PLT 210.0 08/06/2020   GLUCOSE 93 08/06/2020   CHOL 235 (H) 08/06/2020   TRIG (H) 08/06/2020    894.0 Triglyceride is over 400; calculations on Lipids are invalid.   HDL 56.70 08/06/2020   LDLDIRECT 86.0 08/06/2020   LDLCALC 67 08/21/2013   ALT 38 08/06/2020   AST 43 (H) 08/06/2020   NA 138 08/06/2020   K 4.0 08/06/2020   CL 101 08/06/2020   CREATININE 0.98 08/06/2020   BUN 12 08/06/2020   CO2 27 08/06/2020   TSH 1.81 08/06/2020   PSA 0.55 08/06/2020   INR 1.1 (H) 08/06/2020    DG ABD ACUTE 2+V W 1V CHEST  Result Date: 05/06/2019 CLINICAL DATA:  Left flank pain EXAM: DG ABDOMEN ACUTE W/ 1V CHEST COMPARISON:  03/18/2014 FINDINGS: There is no evidence of dilated bowel loops or free intraperitoneal air. No radiopaque calculi or other significant radiographic abnormality is seen. Heart size and mediastinal contours are within normal limits. Both lungs are clear. IMPRESSION: Negative abdominal radiographs.  No acute cardiopulmonary disease. Electronically Signed   By: Charlett Nose M.D.   On: 05/06/2019 11:54     Assessment & Plan:   Philip Zimmerman was seen today for annual exam and hypertension.  Diagnoses and all orders for this visit:  Essential hypertension- He has developed stage I hypertension.  His labs are negative for endorgan damage.  His EKG is reassuring.  I recommended that he treat this with nebivolol. -     Basic metabolic panel; Future -     CBC with Differential/Platelet; Future -     EKG 12-Lead -     TSH; Future -     Urinalysis, Routine w reflex microscopic; Future -  VITAMIN D 25 Hydroxy (Vit-D Deficiency, Fractures); Future -     VITAMIN D 25 Hydroxy (Vit-D Deficiency, Fractures) -     Urinalysis, Routine w reflex microscopic -     TSH -     CBC with Differential/Platelet -     Basic metabolic panel -     nebivolol (BYSTOLIC) 5 MG tablet; Take 1 tablet (5 mg total) by mouth daily.  Elevated LFTs- His LFTs are lower than they were before but are still elevated.  His MELD score is reassuring.  I think this is caused by his alcohol intake.  I have asked him to avoid alcohol. -     Hepatic function panel; Future -     Protime-INR; Future -     Protime-INR -     Hepatic function panel  Hypertriglyceridemia- I recommended that he avoid alcohol and improve his lifestyle modifications.  We will treat this with icosapent ethyl. -     icosapent Ethyl (VASCEPA) 1 g capsule; Take 2 capsules (2 g total) by mouth 2 (two) times daily.  Routine general medical examination at a health care facility- Exam completed, labs reviewed- statin tx is not indicated, he refused a flu vaccine, he agreed to a Tdap, cancer screenings addressed, patient education material was given. -     Lipid panel; Future -     PSA; Future -     PSA -     Lipid panel  Colon cancer screening -     Ambulatory referral to Gastroenterology  Tobacco abuse -     Ambulatory Referral for Lung Cancer Scre  Vitamin D deficiency disease -     Cholecalciferol 1.25 MG (50000 UT) capsule; Take 1 capsule (50,000 Units  total) by mouth once a week.  Other orders -     Tdap vaccine greater than or equal to 7yo IM -     Hepatitis A vaccine adult IM -     LDL cholesterol, direct   I have discontinued Helyn App Jr.'s icosapent Ethyl and predniSONE. I am also having him start on Vascepa, nebivolol, and Cholecalciferol. Additionally, I am having him maintain his traMADol.  Meds ordered this encounter  Medications  . icosapent Ethyl (VASCEPA) 1 g capsule    Sig: Take 2 capsules (2 g total) by mouth 2 (two) times daily.    Dispense:  360 capsule    Refill:  1  . nebivolol (BYSTOLIC) 5 MG tablet    Sig: Take 1 tablet (5 mg total) by mouth daily.    Dispense:  90 tablet    Refill:  1  . Cholecalciferol 1.25 MG (50000 UT) capsule    Sig: Take 1 capsule (50,000 Units total) by mouth once a week.    Dispense:  12 capsule    Refill:  1   In addition to time spent on CPE, I spent 45 minutes in preparing to see the patient by review of recent labs,  obtaining and reviewing separately obtained history, communicating with the patient, ordering medications and tests, and documenting clinical information in the EHR including the differential Dx, treatment, and any further evaluation and other management of 1. Essential hypertension 2. Elevated LFTs 3. Hypertriglyceridemia 4. Vitamin D deficiency disease      Follow-up: Return in about 6 months (around 02/05/2021).  Sanda Linger, MD

## 2020-08-06 NOTE — Patient Instructions (Signed)

## 2020-08-07 DIAGNOSIS — E559 Vitamin D deficiency, unspecified: Secondary | ICD-10-CM | POA: Insufficient documentation

## 2020-08-07 MED ORDER — CHOLECALCIFEROL 1.25 MG (50000 UT) PO CAPS: 50000.0000 [IU] | ORAL_CAPSULE | ORAL | 1 refills | Status: DC

## 2020-08-12 ENCOUNTER — Ambulatory Visit
Admission: RE | Admit: 2020-08-12 | Discharge: 2020-08-12 | Disposition: A | Discharge status: Home / Self Care | Payer: BC Managed Care – PPO | Source: Ambulatory Visit | Attending: Orthopaedic Surgery | Admitting: Orthopaedic Surgery

## 2020-08-12 ENCOUNTER — Other Ambulatory Visit: Payer: Self-pay

## 2020-08-12 DIAGNOSIS — M25561 Pain in right knee: Secondary | ICD-10-CM | POA: Diagnosis not present

## 2020-08-12 DIAGNOSIS — G8929 Other chronic pain: Secondary | ICD-10-CM

## 2020-08-17 ENCOUNTER — Other Ambulatory Visit: Payer: Self-pay | Admitting: *Deleted

## 2020-08-17 DIAGNOSIS — F17210 Nicotine dependence, cigarettes, uncomplicated: Secondary | ICD-10-CM

## 2020-08-18 ENCOUNTER — Ambulatory Visit (INDEPENDENT_AMBULATORY_CARE_PROVIDER_SITE_OTHER): Payer: BC Managed Care – PPO | Admitting: Orthopaedic Surgery

## 2020-08-18 ENCOUNTER — Other Ambulatory Visit: Payer: Self-pay

## 2020-08-18 ENCOUNTER — Encounter: Payer: Self-pay | Admitting: Orthopaedic Surgery

## 2020-08-18 DIAGNOSIS — M25561 Pain in right knee: Secondary | ICD-10-CM | POA: Diagnosis not present

## 2020-08-18 DIAGNOSIS — G8929 Other chronic pain: Secondary | ICD-10-CM

## 2020-08-18 NOTE — Progress Notes (Signed)
Office Visit Note   Patient: Philip Zimmerman.           Date of Birth: 1966-02-19           MRN: [id number] Visit Date: 08/18/2020              Requested by: Etta Grandchild, MD [address] [place],  [place] [address] PCP: Etta Grandchild, MD   Assessment & Plan: Visit Diagnoses:  1. Chronic pain of right knee     Plan: MRI is negative for meniscal or ligamentous injury.  He does have significant marrow edema of the lateral femoral condyle and small area of marrow edema of the medial tibial plateau consistent with insufficiency fractures.  I recommended a knee brace for support and activity restrictions were reviewed with the patient in detail.  I explained that MRI findings do not show anything surgical at this time.  He did recently have a vitamin D level taken and it was significantly low.  He has been placed on supplements for this by his PCP.  In the meantime he will just continue to take it easy and we will see him back in 6 weeks for recheck.  Follow-Up Instructions: Return in about 6 weeks (around 09/29/2020).   Orders:  No orders of the defined types were placed in this encounter.  No orders of the defined types were placed in this encounter.     Procedures: No procedures performed   Clinical Data: No additional findings.   Subjective: Chief Complaint  Patient presents with  . Right Knee - Pain    Philip Zimmerman returns today for follow-up of his right knee pain as well as recent MRI.  He reports slight improvement in pain.  He is unable to jog or do anything other than basic walking.   Review of Systems  Constitutional: Negative.   All other systems reviewed and are negative.    Objective: Vital Signs: There were no vitals taken for this visit.  Physical Exam Vitals and nursing note reviewed.  Constitutional:      Appearance: He is well-developed.  Pulmonary:     Effort: Pulmonary effort is normal.  Abdominal:     Palpations: Abdomen is  soft.  Skin:    General: Skin is warm.  Neurological:     Mental Status: He is alert and oriented to person, place, and time.  Psychiatric:        Behavior: Behavior normal.        Thought Content: Thought content normal.        Judgment: Judgment normal.     Ortho Exam Right knee shows no joint effusion.  There is bony tenderness of the lateral femoral condyle and medial tibial plateau. Specialty Comments:  No specialty comments available.  Imaging: No results found.   PMFS History: Patient Active Problem List   Diagnosis Date Noted  . Vitamin D deficiency disease 08/07/2020  . Tobacco abuse 08/06/2020  . Colon cancer screening 05/06/2019  . Elevated LFTs 07/12/2018  . Essential hypertension 07/11/2018  . IBS (irritable bowel syndrome) 06/30/2015  . Hypertriglyceridemia 05/23/2011  . Routine general medical examination at a health care facility 01/13/2011  . ALLERGIC RHINITIS 12/05/2007  . GERD 12/05/2007   Past Medical History:  Diagnosis Date  . ALLERGIC RHINITIS 12/05/2007  . GERD 12/05/2007  . HYPERLIPIDEMIA 12/05/2007  . Hypertriglyceridemia 05/23/2011  . TRANSAMINASES, SERUM, ELEVATED 03/08/2010  . Unspecified disorder of kidney and ureter 03/12/2010  Family History  Problem Relation Age of Onset  . Heart disease Father   . Hypertension Father   . Hyperlipidemia Father   . Gout Father   . Cancer Maternal Grandmother        breast cancer  . Diabetes Maternal Grandmother   . Cancer Maternal Grandfather        throat cancer    History reviewed. No pertinent surgical history. Social History   Occupational History  . Occupation: Production designer, theatre/television/film [employer]  Tobacco Use  . Smoking status: Current Every Day Smoker    Packs/day: 0.50    Years: 25.00    Pack years: 12.50    Types: Cigarettes  . Smokeless tobacco: Never Used  Substance and Sexual Activity  . Alcohol use: Yes    Alcohol/week: 28.0 standard drinks    Types: 28 Cans of beer per week  .  Drug use: Yes    Types: Marijuana  . Sexual activity: Not Currently    Partners: Female

## 2020-08-25 ENCOUNTER — Telehealth: Payer: Self-pay

## 2020-08-25 NOTE — Telephone Encounter (Signed)
Key: [id number]

## 2020-08-25 NOTE — Telephone Encounter (Signed)
Approved Effective from 08/25/2020 through 08/24/2021.

## 2020-09-23 ENCOUNTER — Encounter: Payer: BC Managed Care – PPO | Admitting: Acute Care

## 2020-09-23 ENCOUNTER — Ambulatory Visit: Payer: BC Managed Care – PPO

## 2020-11-02 ENCOUNTER — Telehealth: Payer: Self-pay | Admitting: Acute Care

## 2020-11-03 NOTE — Telephone Encounter (Signed)
Spoke with pt and rescheduled shared decision visit to televisit on 11/16/20 at 4:00. CT will still be done on 11/20/20 as previously scheduled. Pt verbalized understanding. Nothing further needed.

## 2020-11-04 ENCOUNTER — Ambulatory Visit: Payer: BC Managed Care – PPO

## 2020-11-04 ENCOUNTER — Encounter: Payer: BC Managed Care – PPO | Admitting: Acute Care

## 2020-11-16 ENCOUNTER — Encounter: Payer: Self-pay | Admitting: Acute Care

## 2020-11-16 ENCOUNTER — Other Ambulatory Visit: Payer: Self-pay

## 2020-11-16 ENCOUNTER — Ambulatory Visit (INDEPENDENT_AMBULATORY_CARE_PROVIDER_SITE_OTHER): Payer: BC Managed Care – PPO | Admitting: Acute Care

## 2020-11-16 DIAGNOSIS — F17210 Nicotine dependence, cigarettes, uncomplicated: Secondary | ICD-10-CM | POA: Diagnosis not present

## 2020-11-16 NOTE — Progress Notes (Signed)
Virtual Visit via Telephone Note  I connected with Philip Zimmerman. on 11/16/20 at  4:00 PM EDT by telephone and verified that I am speaking with the correct person using two identifiers.  Location: Patient: At Home Provider: [address] [place], [place], [address]    I discussed the limitations, risks, security and privacy concerns of performing an evaluation and management service by telephone and the availability of in person appointments. I also discussed with the patient that there may be a patient responsible charge related to this service. The patient expressed understanding and agreed to proceed.    Shared Decision Making Visit Lung Cancer Screening Program [phone])   Eligibility: Age 55 y.o. Pack Years Smoking History Calculation 20 pack year smoking history (# packs/per year x # years smoked) Recent History of coughing up blood  no Unexplained weight loss? no ( >Than 15 pounds within the last 6 months ) Prior History Lung / other cancer no (Diagnosis within the last 5 years already requiring surveillance chest CT Scans). Smoking Status Current Smoker Former Smokers: Years since quit: NA  Quit Date: NA  Visit Components: Discussion included one or more decision making aids. yes Discussion included risk/benefits of screening. yes Discussion included potential follow up diagnostic testing for abnormal scans. yes Discussion included meaning and risk of over diagnosis. yes Discussion included meaning and risk of False Positives. yes Discussion included meaning of total radiation exposure. yes  Counseling Included: Importance of adherence to annual lung cancer LDCT screening. yes Impact of comorbidities on ability to participate in the program. yes Ability and willingness to under diagnostic treatment. yes  Smoking Cessation Counseling: Current Smokers:  Discussed importance of smoking cessation. yes Information about tobacco cessation classes and  interventions provided to patient. yes Patient provided with "ticket" for LDCT Scan. yes Symptomatic Patient. no  Counseling Diagnosis Code: Tobacco Use Z72.0 Asymptomatic Patient yes  Counseling (Intermediate counseling: > three minutes counseling) G2836 Former Smokers:  Discussed the importance of maintaining cigarette abstinence. yes Diagnosis Code: Personal History of Nicotine Dependence. O29.476 Information about tobacco cessation classes and interventions provided to patient. Yes Patient provided with "ticket" for LDCT Scan. yes Written Order for Lung Cancer Screening with LDCT placed in Epic. Yes (CT Chest Lung Cancer Screening Low Dose W/O CM) [id number] Z12.2-Screening of respiratory organs Z87.891-Personal history of nicotine dependence  I have spent 25 minutes of face to face time with Philip Zimmerman discussing the risks and benefits of lung cancer screening. We viewed a power point together that explained in detail the above noted topics. We paused at intervals to allow for questions to be asked and answered to ensure understanding.We discussed that the single most powerful action that he can take to decrease his risk of developing lung cancer is to quit smoking. We discussed whether or not he is ready to commit to setting a quit date. We discussed options for tools to aid in quitting smoking including nicotine replacement therapy, non-nicotine medications, support groups, Quit Smart classes, and behavior modification. We discussed that often times setting smaller, more achievable goals, such as eliminating 1 cigarette a day for a week and then 2 cigarettes a day for a week can be helpful in slowly decreasing the number of cigarettes smoked. This allows for a sense of accomplishment as well as providing a clinical benefit. I gave him the " Be Stronger Than Your Excuses" card with contact information for community resources, classes, free nicotine replacement therapy, and access to mobile  apps,  text messaging, and on-line smoking cessation help. I have also given him my card and contact information in the event he needs to contact me. We discussed the time and location of the scan, and that either Abigail Miyamoto RN or I will call with the results within 24-48 hours of receiving them. I have offered him  a copy of the power point we viewed  as a resource in the event they need reinforcement of the concepts we discussed today in the office. The patient verbalized understanding of all of  the above and had no further questions upon leaving the office. They have my contact information in the event they have any further questions.  I spent 4-5 minutes counseling on smoking cessation and the health risks of continued tobacco abuse.  I explained to the patient that there has been a high incidence of coronary artery disease noted on these exams. I explained that this is a non-gated exam therefore degree or severity cannot be determined. This patient is not currently on statin therapy. I have asked the patient to follow-up with their PCP regarding any incidental finding of coronary artery disease and management with diet or medication as their PCP  feels is clinically indicated. The patient verbalized understanding of the above and had no further questions upon completion of the visit.      Bevelyn Ngo, NP 11/16/2020

## 2020-11-16 NOTE — Patient Instructions (Signed)
Thank you for participating in the [facility]. It was our pleasure to meet you today. We will call you with the results of your scan within the next few days. Your scan will be assigned a Lung RADS category score by the physicians reading the scans.  This Lung RADS score determines follow up scanning.  See below for description of categories, and follow up screening recommendations. We will be in touch to schedule your follow up screening annually or based on recommendations of our providers. We will fax a copy of your scan results to your Primary Care Physician, or the physician who referred you to the program, to ensure they have the results. Please call the office if you have any questions or concerns regarding your scanning experience or results.  Our office number is [phone]. Please speak with Denise Phelps, RN. She is our Lung Cancer Screening RN. If she is unavailable when you call, please have the office staff send her a message. She will return your call at her earliest convenience. Remember, if your scan is normal, we will scan you annually as long as you continue to meet the criteria for the program. (Age 55-77, Current smoker or smoker who has quit within the last 15 years). If you are a smoker, remember, quitting is the single most powerful action that you can take to decrease your risk of lung cancer and other pulmonary, breathing related problems. We know quitting is hard, and we are here to help.  Please let us know if there is anything we can do to help you meet your goal of quitting. If you are a former smoker, congratulations. We are proud of you! Remain smoke free! Remember you can refer friends or family members through the number above.  We will screen them to make sure they meet criteria for the program. Thank you for helping us take better care of you by participating in Lung Screening.  Lung RADS Categories:  Lung RADS 1: no nodules  or definitely non-concerning nodules.  Recommendation is for a repeat annual scan in 12 months.  Lung RADS 2:  nodules that are non-concerning in appearance and behavior with a very low likelihood of becoming an active cancer. Recommendation is for a repeat annual scan in 12 months.  Lung RADS 3: nodules that are probably non-concerning , includes nodules with a low likelihood of becoming an active cancer.  Recommendation is for a 6-month repeat screening scan. Often noted after an upper respiratory illness. We will be in touch to make sure you have no questions, and to schedule your 6-month scan.  Lung RADS 4 A: nodules with concerning findings, recommendation is most often for a follow up scan in 3 months or additional testing based on our provider's assessment of the scan. We will be in touch to make sure you have no questions and to schedule the recommended 3 month follow up scan.  Lung RADS 4 B:  indicates findings that are concerning. We will be in touch with you to schedule additional diagnostic testing based on our provider's  assessment of the scan.   

## 2020-11-20 ENCOUNTER — Ambulatory Visit
Admission: RE | Admit: 2020-11-20 | Discharge: 2020-11-20 | Disposition: A | Discharge status: Home / Self Care | Payer: BC Managed Care – PPO | Source: Ambulatory Visit | Attending: Acute Care | Admitting: Acute Care

## 2020-11-20 ENCOUNTER — Other Ambulatory Visit: Payer: Self-pay

## 2020-11-20 DIAGNOSIS — F17210 Nicotine dependence, cigarettes, uncomplicated: Secondary | ICD-10-CM

## 2020-11-30 ENCOUNTER — Encounter: Payer: Self-pay | Admitting: *Deleted

## 2020-11-30 ENCOUNTER — Telehealth: Payer: Self-pay | Admitting: Acute Care

## 2020-11-30 DIAGNOSIS — F17210 Nicotine dependence, cigarettes, uncomplicated: Secondary | ICD-10-CM

## 2020-11-30 NOTE — Telephone Encounter (Signed)
CT result letter sent to pt via Mychart. Copy of CT faxed to PCP. Order placed for 1 yr f/u low dose ct.   

## 2020-12-08 NOTE — Progress Notes (Signed)
Please call patient and let them  know their  low dose Ct was read as a Lung RADS 2: nodules that are benign in appearance and behavior with a very low likelihood of becoming a clinically active cancer due to size or lack of growth. Recommendation per radiology is for a repeat LDCT in 12 months. .Please let them  know we will order and schedule their  annual screening scan for 11/2021. Please let them  know there was notation of CAD on their  scan.  Please remind the patient  that this is a non-gated exam therefore degree or severity of disease  cannot be determined. Please have them  follow up with their PCP regarding potential risk factor modification, dietary therapy or pharmacologic therapy if clinically indicated. Pt.  is not  currently on statin therapy. Please place order for annual  screening scan for  11/2021 and fax results to PCP. Thanks so much.  Denise, please let them know 2 vessel CAD and aortic atherosclerosis. Have them follow up with PCP. Thanks so much

## 2021-04-02 ENCOUNTER — Encounter: Payer: Self-pay | Admitting: *Deleted

## 2021-11-22 ENCOUNTER — Other Ambulatory Visit: Payer: BC Managed Care – PPO

## 2022-03-07 ENCOUNTER — Encounter: Payer: Self-pay | Admitting: *Deleted

## 2022-07-14 ENCOUNTER — Encounter: Payer: 59 | Admitting: Internal Medicine

## 2022-07-17 IMAGING — MR MR KNEE*R* W/O CM
4 of 6 series · 18 of 40 positions shown · non-contrast
Comparison: Radiographs 07/03/2020

CLINICAL DATA: Chronic right knee pain

EXAM:
MRI OF THE RIGHT KNEE WITHOUT CONTRAST
TECHNIQUE: Multiplanar, multisequence MR imaging of the knee was performed. No
intravenous contrast was administered.

[Series 2: T2 fat-sat · axial · 4.0mm · 0.29mm/px · z∈[-69,+23]mm · 3 of 31 slices shown (1 of 2)]
[im 5/31]
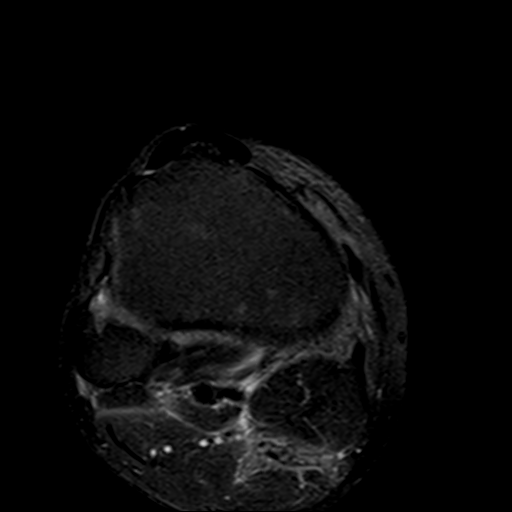
[im 18/31]
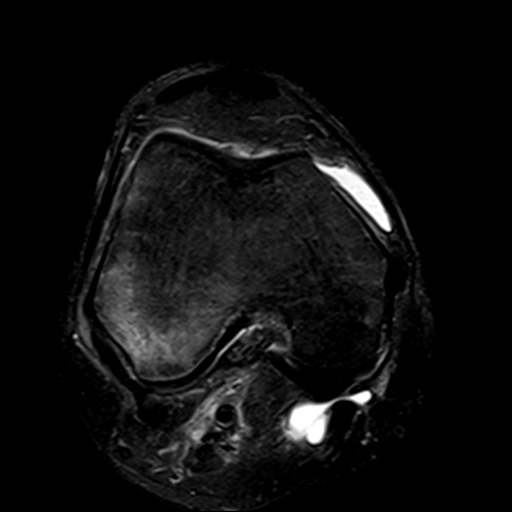
[im 26/31]
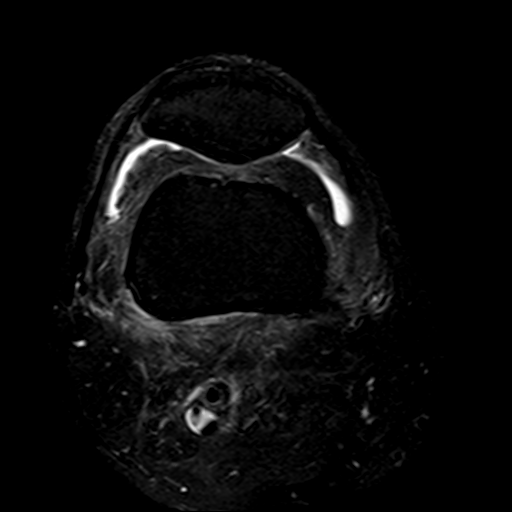

[Series 4: T2 fat-sat · coronal · 4.0mm · 0.29mm/px · 3 of 30 slices shown (2 of 2)]
[im 6/30]
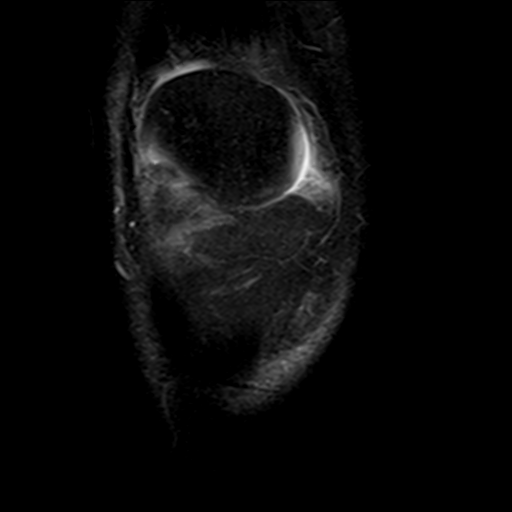
[im 18/30]
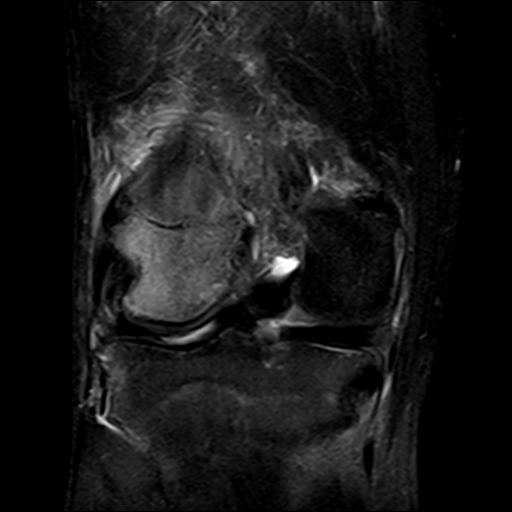
[im 30/30]
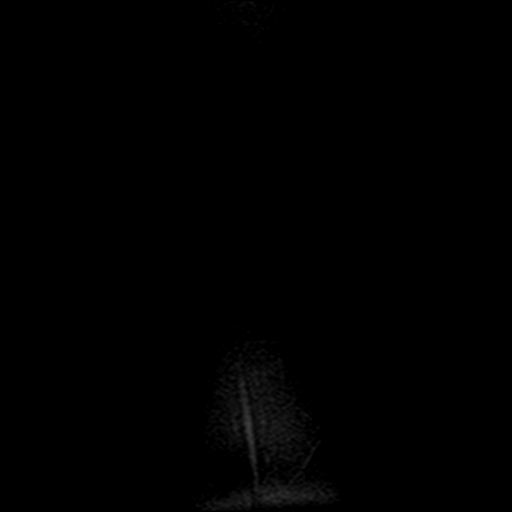

[Series 5: PD fat-sat · coronal · 3.0mm · 0.29mm/px · 8 of 37 slices shown (1 of 2)]
[im 1/37]
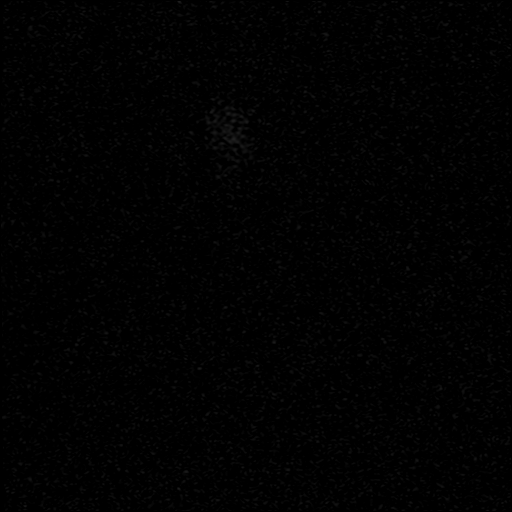
[im 6/37]
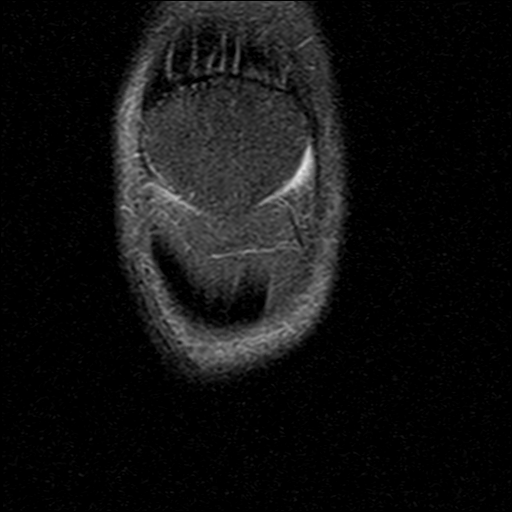
[im 11/37]
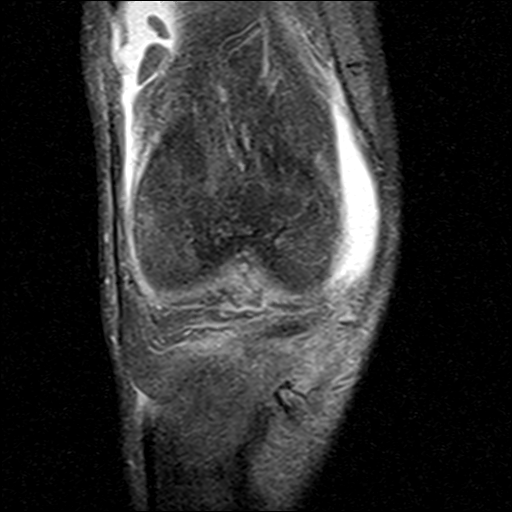
[im 16/37]
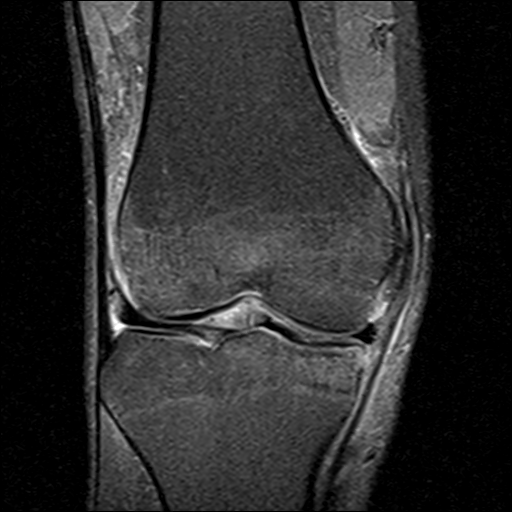
[im 21/37]
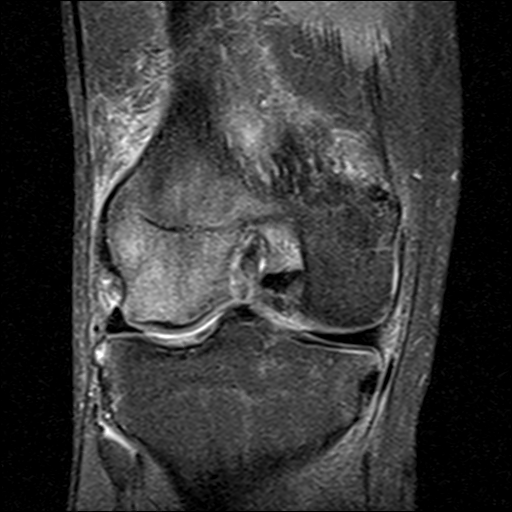
[im 26/37]
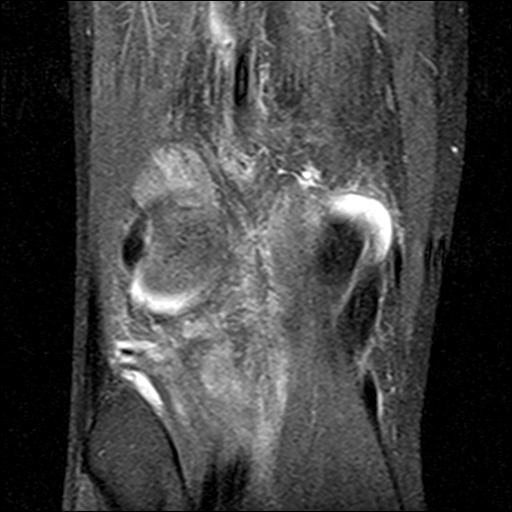
[im 31/37]
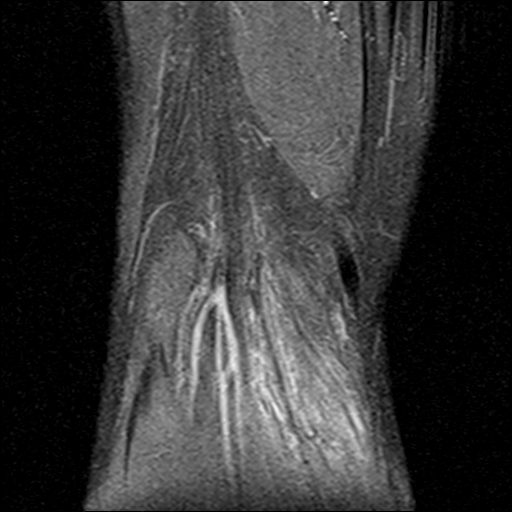
[im 37/37]
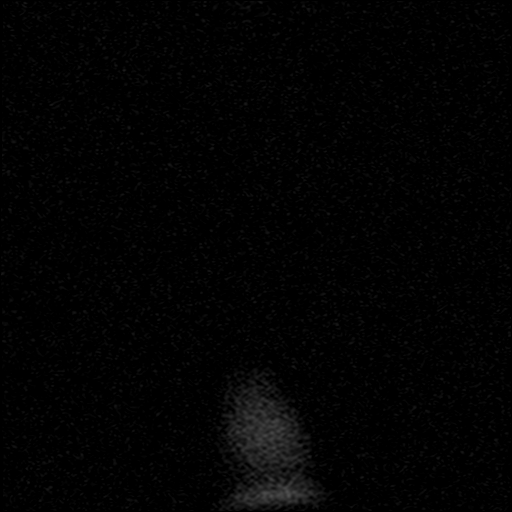

[Series 6: PD fat-sat · sagittal · 3.0mm · 0.29mm/px · 4 of 30 slices shown (2 of 2)]
[im 1/30]
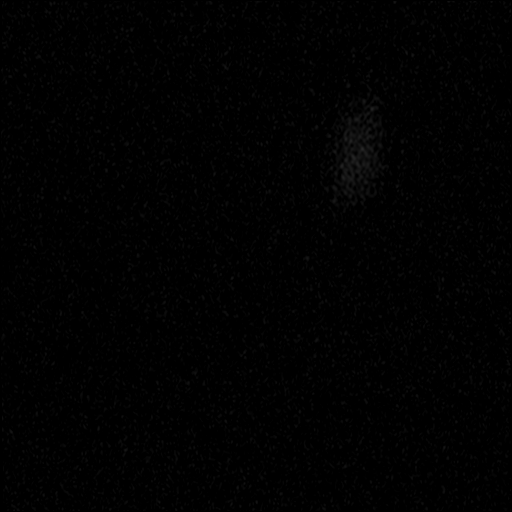
[im 6/30]
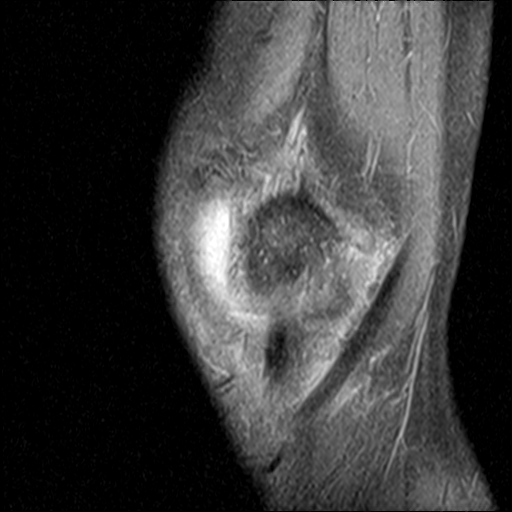
[im 18/30]
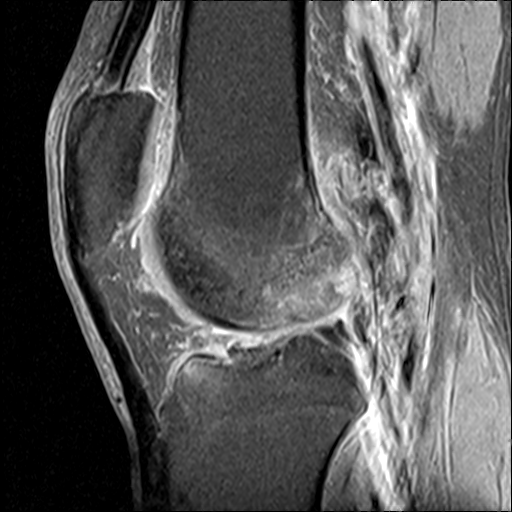
[im 30/30]
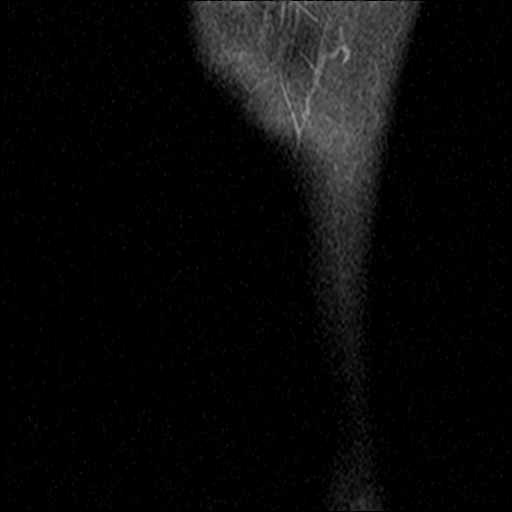

[18 of 40 positions shown; findings below may reference images not displayed]

FINDINGS: MENISCI

Medial meniscus:  Unremarkable

Lateral meniscus:  Unremarkable

LIGAMENTS

Cruciates:  Unremarkable

Collaterals: Mild edema tracks adjacent to the MCL. This can be
incidental but in the appropriate clinical circumstance could
represent grade 1 sprain.

CARTILAGE

Patellofemoral: Small focus of chondral and subcortical marrow edema
centrally along the femoral trochlear groove on image 14 of series 7
measuring about 0.6 cm in diameter, probably a small non-fragmented
osteochondral lesion.

Medial:  Unremarkable

Lateral: Generally moderate chondral thinning with some focal nearly
full-thickness chondral thinning posteromedially along the lateral
femoral condyle for example on image 13 series 4.

Joint:  Small knee effusion with mild synovitis.

Popliteal Fossa: Infiltrative edema in the popliteal fossa. Small
Baker's cyst.

Extensor Mechanism:  Unremarkable

Bones: Subcortical stress fracture along much of the lateral femoral
condyle as shown on images 13 through 17 of series 3, with
considerable surrounding marrow edema.

Low-grade endosteal and marrow edema in the medial tibial plateau
extending to the anterior tibial spine, likewise with some bandlike
low T1 signal on image 17 of series 3 raising the possibility of
subcortical stress fracture. Subtle low signal and endosteal edema
posteriorly in the lateral tibial plateau.

Other: No supplemental non-categorized findings.
IMPRESSION: 1. Subcortical stress fracture in the lateral femoral condyle with
considerable surrounding edema.
2. Potential low-grade subcortical stress fracture also in the
medial tibial plateau, with mild endosteal and marrow edema. Small
knee effusion with mild synovitis. Small Baker's cyst.
3. Infiltrative edema in the popliteal space.

## 2022-08-15 ENCOUNTER — Ambulatory Visit (INDEPENDENT_AMBULATORY_CARE_PROVIDER_SITE_OTHER): Payer: 59 | Admitting: Internal Medicine

## 2022-08-15 ENCOUNTER — Encounter: Payer: Self-pay | Admitting: Internal Medicine

## 2022-08-15 VITALS — BP 134/78 | HR 90 | Temp 98.0°F | Resp 16 | Ht 74.0 in | Wt 173.0 lb

## 2022-08-15 DIAGNOSIS — Z0000 Encounter for general adult medical examination without abnormal findings: Secondary | ICD-10-CM | POA: Diagnosis not present

## 2022-08-15 DIAGNOSIS — Z1211 Encounter for screening for malignant neoplasm of colon: Secondary | ICD-10-CM

## 2022-08-15 DIAGNOSIS — E785 Hyperlipidemia, unspecified: Secondary | ICD-10-CM

## 2022-08-15 DIAGNOSIS — I10 Essential (primary) hypertension: Secondary | ICD-10-CM | POA: Diagnosis not present

## 2022-08-15 DIAGNOSIS — E781 Pure hyperglyceridemia: Secondary | ICD-10-CM | POA: Diagnosis not present

## 2022-08-15 DIAGNOSIS — K219 Gastro-esophageal reflux disease without esophagitis: Secondary | ICD-10-CM | POA: Diagnosis not present

## 2022-08-15 DIAGNOSIS — R7989 Other specified abnormal findings of blood chemistry: Secondary | ICD-10-CM

## 2022-08-15 DIAGNOSIS — Z125 Encounter for screening for malignant neoplasm of prostate: Secondary | ICD-10-CM

## 2022-08-15 DIAGNOSIS — L700 Acne vulgaris: Secondary | ICD-10-CM

## 2022-08-15 DIAGNOSIS — Z720 Tobacco use: Secondary | ICD-10-CM

## 2022-08-15 LAB — CBC WITH DIFFERENTIAL/PLATELET
Basophils Absolute: 0 10*3/uL (ref 0.0–0.1)
Basophils Relative: 0.2 % (ref 0.0–3.0)
Eosinophils Absolute: 0 10*3/uL (ref 0.0–0.7)
Eosinophils Relative: 0.3 % (ref 0.0–5.0)
HCT: 43.4 % (ref 39.0–52.0)
Hemoglobin: 15 g/dL (ref 13.0–17.0)
Lymphocytes Relative: 23.9 % (ref 12.0–46.0)
Lymphs Abs: 2.2 10*3/uL (ref 0.7–4.0)
MCHC: 34.4 g/dL (ref 30.0–36.0)
MCV: 90.4 fl (ref 78.0–100.0)
Monocytes Absolute: 0.4 10*3/uL (ref 0.1–1.0)
Monocytes Relative: 4.8 % (ref 3.0–12.0)
Neutro Abs: 6.6 10*3/uL (ref 1.4–7.7)
Neutrophils Relative %: 70.8 % (ref 43.0–77.0)
Platelets: 214 10*3/uL (ref 150.0–400.0)
RBC: 4.8 Mil/uL (ref 4.22–5.81)
RDW: 13.3 % (ref 11.5–15.5)
WBC: 9.3 10*3/uL (ref 4.0–10.5)

## 2022-08-15 LAB — BASIC METABOLIC PANEL
BUN: 12 mg/dL (ref 6–23)
CO2: 27 mEq/L (ref 19–32)
Calcium: 9.6 mg/dL (ref 8.4–10.5)
Chloride: 102 mEq/L (ref 96–112)
Creatinine, Ser: 1.03 mg/dL (ref 0.40–1.50)
GFR: 82.14 mL/min (ref >60.00)
Glucose, Bld: 93 mg/dL (ref 70–99)
Potassium: 4.3 mEq/L (ref 3.5–5.1)
Sodium: 138 mEq/L (ref 135–145)

## 2022-08-15 LAB — URINALYSIS, ROUTINE W REFLEX MICROSCOPIC
Bilirubin Urine: NEGATIVE
Hgb urine dipstick: NEGATIVE
Ketones, ur: NEGATIVE
Leukocytes,Ua: NEGATIVE
Nitrite: NEGATIVE
RBC / HPF: NONE SEEN (ref 0–?)
Specific Gravity, Urine: >=1.03 — AB (ref 1.000–1.030)
Total Protein, Urine: NEGATIVE
Urine Glucose: NEGATIVE
Urobilinogen, UA: 0.2 (ref 0.0–1.0)
WBC, UA: NONE SEEN (ref 0–?)
pH: 5.5 (ref 5.0–8.0)

## 2022-08-15 LAB — HEPATIC FUNCTION PANEL
ALT: 32 U/L (ref 0–53)
AST: 25 U/L (ref 0–37)
Albumin: 4.7 g/dL (ref 3.5–5.2)
Alkaline Phosphatase: 45 U/L (ref 39–117)
Bilirubin, Direct: 0.2 mg/dL (ref 0.0–0.3)
Total Bilirubin: 1.1 mg/dL (ref 0.2–1.2)
Total Protein: 7.3 g/dL (ref 6.0–8.3)

## 2022-08-15 LAB — PROTIME-INR
INR: 1.1 ratio — ABNORMAL HIGH (ref 0.8–1.0)
Prothrombin Time: 11.6 s (ref 9.6–13.1)

## 2022-08-15 LAB — LIPID PANEL
Cholesterol: 210 mg/dL — ABNORMAL HIGH (ref 0–200)
HDL: 54.5 mg/dL (ref >39.00)
Total CHOL/HDL Ratio: 4
Triglycerides: 471 mg/dL — ABNORMAL HIGH (ref 0.0–149.0)

## 2022-08-15 LAB — LDL CHOLESTEROL, DIRECT: Direct LDL: 78 mg/dL

## 2022-08-15 LAB — PSA: PSA: 0.61 ng/mL (ref 0.10–4.00)

## 2022-08-15 LAB — TSH: TSH: 2.99 u[IU]/mL (ref 0.35–5.50)

## 2022-08-15 MED ORDER — MINOCYCLINE HCL 50 MG PO TABS
50.0000 mg | ORAL_TABLET | Freq: Two times a day (BID) | ORAL | 0 refills | Status: AC
Start: 2022-08-15 — End: 2022-08-29

## 2022-08-15 NOTE — Patient Instructions (Signed)
Health Maintenance, Male Adopting a healthy lifestyle and getting preventive care are important in promoting health and wellness. Ask your health care provider about: The right schedule for you to have regular tests and exams. Things you can do on your own to prevent diseases and keep yourself healthy. What should I know about diet, weight, and exercise? Eat a healthy diet  Eat a diet that includes plenty of vegetables, fruits, low-fat dairy products, and lean protein. Do not eat a lot of foods that are high in solid fats, added sugars, or sodium. Maintain a healthy weight Body mass index (BMI) is a measurement that can be used to identify possible weight problems. It estimates body fat based on height and weight. Your health care provider can help determine your BMI and help you achieve or maintain a healthy weight. Get regular exercise Get regular exercise. This is one of the most important things you can do for your health. Most adults should: Exercise for at least 150 minutes each week. The exercise should increase your heart rate and make you sweat (moderate-intensity exercise). Do strengthening exercises at least twice a week. This is in addition to the moderate-intensity exercise. Spend less time sitting. Even light physical activity can be beneficial. Watch cholesterol and blood lipids Have your blood tested for lipids and cholesterol at 55 years of age, then have this test every 5 years. You may need to have your cholesterol levels checked more often if: Your lipid or cholesterol levels are high. You are older than 55 years of age. You are at high risk for heart disease. What should I know about cancer screening? Many types of cancers can be detected early and may often be prevented. Depending on your health history and family history, you may need to have cancer screening at various ages. This may include screening for: Colorectal cancer. Prostate cancer. Skin cancer. Lung  cancer. What should I know about heart disease, diabetes, and high blood pressure? Blood pressure and heart disease High blood pressure causes heart disease and increases the risk of stroke. This is more likely to develop in people who have high blood pressure readings or are overweight. Talk with your health care provider about your target blood pressure readings. Have your blood pressure checked: Every 3-5 years if you are 18-39 years of age. Every year if you are 40 years old or older. If you are between the ages of 65 and 75 and are a current or former smoker, ask your health care provider if you should have a one-time screening for abdominal aortic aneurysm (AAA). Diabetes Have regular diabetes screenings. This checks your fasting blood sugar level. Have the screening done: Once every three years after age 45 if you are at a normal weight and have a low risk for diabetes. More often and at a younger age if you are overweight or have a high risk for diabetes. What should I know about preventing infection? Hepatitis B If you have a higher risk for hepatitis B, you should be screened for this virus. Talk with your health care provider to find out if you are at risk for hepatitis B infection. Hepatitis C Blood testing is recommended for: Everyone born from 1945 through 1965. Anyone with known risk factors for hepatitis C. Sexually transmitted infections (STIs) You should be screened each year for STIs, including gonorrhea and chlamydia, if: You are sexually active and are younger than 55 years of age. You are older than 55 years of age and your   health care provider tells you that you are at risk for this type of infection. Your sexual activity has changed since you were last screened, and you are at increased risk for chlamydia or gonorrhea. Ask your health care provider if you are at risk. Ask your health care provider about whether you are at high risk for HIV. Your health care provider  may recommend a prescription medicine to help prevent HIV infection. If you choose to take medicine to prevent HIV, you should first get tested for HIV. You should then be tested every 3 months for as long as you are taking the medicine. Follow these instructions at home: Alcohol use Do not drink alcohol if your health care provider tells you not to drink. If you drink alcohol: Limit how much you have to 0-2 drinks a day. Know how much alcohol is in your drink. In the [place] one drink equals one 12 oz bottle of beer (355 mL), one 5 oz glass of wine (148 mL), or one 1 oz glass of hard liquor (44 mL). Lifestyle Do not use any products that contain nicotine or tobacco. These products include cigarettes, chewing tobacco, and vaping devices, such as e-cigarettes. If you need help quitting, ask your health care provider. Do not use street drugs. Do not share needles. Ask your health care provider for help if you need support or information about quitting drugs. General instructions Schedule regular health, dental, and eye exams. Stay current with your vaccines. Tell your health care provider if: You often feel depressed. You have ever been abused or do not feel safe at home. Summary Adopting a healthy lifestyle and getting preventive care are important in promoting health and wellness. Follow your health care provider's instructions about healthy diet, exercising, and getting tested or screened for diseases. Follow your health care provider's instructions on monitoring your cholesterol and blood pressure. This information is not intended to replace advice given to you by your health care provider. Make sure you discuss any questions you have with your health care provider. Document Revised: 09/14/2020 Document Reviewed: 09/14/2020 Elsevier Patient Education  2023 [employer] Inc[employer]  

## 2022-08-15 NOTE — Progress Notes (Signed)
Subjective:  Patient ID: Philip Ore., male    DOB: 16-Apr-1966  Age: 57 y.o. MRN: [id number]  CC: Annual Exam, Hypertension, and Hyperlipidemia   HPI Philip Zimmerman. presents for a CPX and f/up ---  He does an 18-minute mile on the treadmill every other day.  His endurance is good.  He denies chest pain, shortness of breath, diaphoresis, palpitations, or edema.  Outpatient Medications Prior to Visit  Medication Sig Dispense Refill   Cholecalciferol 1.25 MG (50000 UT) capsule Take 1 capsule (50,000 Units total) by mouth once a week. 12 capsule 1   nebivolol (BYSTOLIC) 5 MG tablet Take 1 tablet (5 mg total) by mouth daily. 90 tablet 1   traMADol (ULTRAM) 50 MG tablet Take 1 tablet (50 mg total) by mouth 3 (three) times daily as needed. 30 tablet 0   icosapent Ethyl (VASCEPA) 1 g capsule Take 2 capsules (2 g total) by mouth 2 (two) times daily. 360 capsule 1   No facility-administered medications prior to visit.    ROS Review of Systems  Constitutional: Negative.  Negative for appetite change, fatigue and fever.  HENT: Negative.    Eyes: Negative.   Respiratory:  Negative for cough, chest tightness, shortness of breath and wheezing.   Cardiovascular:  Negative for chest pain, palpitations and leg swelling.  Gastrointestinal:  Negative for abdominal pain, constipation and diarrhea.  Endocrine: Negative.   Genitourinary: Negative.  Negative for difficulty urinating.  Musculoskeletal: Negative.  Negative for arthralgias.  Skin:  Positive for rash. Negative for color change.  Neurological: Negative.  Negative for dizziness and weakness.  Hematological:  Negative for adenopathy. Does not bruise/bleed easily.  Psychiatric/Behavioral: Negative.      Objective:  BP 134/78 (BP Location: Right Arm, Patient Position: Sitting, Cuff Size: Large)   Pulse 90   Temp 98 F (36.7 C) (Oral)   Resp 16   Ht 6\' 2"  (1.88 m)   Wt 173 lb (78.5 kg)   SpO2 96%   BMI 22.21 kg/m   BP  Readings from Last 3 Encounters:  08/15/22 134/78  08/06/20 (!) 142/84  07/03/20 118/76    Wt Readings from Last 3 Encounters:  08/15/22 173 lb (78.5 kg)  08/06/20 175 lb (79.4 kg)  07/10/20 175 lb (79.4 kg)    Physical Exam Vitals reviewed.  HENT:     Nose: Nose normal.     Mouth/Throat:     Mouth: Mucous membranes are moist.  Eyes:     General: No scleral icterus.    Conjunctiva/sclera: Conjunctivae normal.  Cardiovascular:     Rate and Rhythm: Normal rate and regular rhythm.     Heart sounds: Normal heart sounds, S1 normal and S2 normal. No murmur heard.    Comments: EKG- NSR, 82 bpm ?LAE No LVH or Q waves Pulmonary:     Breath sounds: No stridor. No wheezing, rhonchi or rales.  Abdominal:     Palpations: There is no mass.     Tenderness: There is no abdominal tenderness. There is no guarding.     Hernia: No hernia is present.  Musculoskeletal:     Cervical back: Neck supple.     Right lower leg: No edema.     Left lower leg: No edema.  Skin:    General: Skin is warm and dry.     Findings: Erythema and rash present. No lesion.     Comments: Over the upper, anterior torso there are numerous perifollicular erythematous and pustular cystic  lesions.  Neurological:     General: No focal deficit present.     Mental Status: He is alert. Mental status is at baseline.  Psychiatric:        Mood and Affect: Mood normal.        Behavior: Behavior normal.     Lab Results  Component Value Date   WBC 9.3 08/15/2022   HGB 15.0 08/15/2022   HCT 43.4 08/15/2022   PLT 214.0 08/15/2022   GLUCOSE 93 08/15/2022   CHOL 210 (H) 08/15/2022   TRIG (H) 08/15/2022    471.0 Triglyceride is over 400; calculations on Lipids are invalid.   HDL 54.50 08/15/2022   LDLDIRECT 78.0 08/15/2022   LDLCALC 67 08/21/2013   ALT 32 08/15/2022   AST 25 08/15/2022   NA 138 08/15/2022   K 4.3 08/15/2022   CL 102 08/15/2022   CREATININE 1.03 08/15/2022   BUN 12 08/15/2022   CO2 27  08/15/2022   TSH 2.99 08/15/2022   PSA 0.61 08/15/2022   INR 1.1 (H) 08/15/2022    CT CHEST LUNG CA SCREEN LOW DOSE W/O CM  Result Date: 11/22/2020 CLINICAL DATA:  57 year old asymptomatic male current smoker with 20 pack-year smoking history. EXAM: CT CHEST WITHOUT CONTRAST LOW-DOSE FOR LUNG CANCER SCREENING TECHNIQUE: Multidetector CT imaging of the chest was performed following the standard protocol without IV contrast. COMPARISON:  None. FINDINGS: Cardiovascular: Normal heart size. No significant pericardial effusion/thickening. Left anterior descending and left circumflex coronary atherosclerosis. Atherosclerotic nonaneurysmal thoracic aorta. Normal caliber pulmonary arteries. Mediastinum/Nodes: No discrete thyroid nodules. Unremarkable esophagus. No pathologically enlarged axillary, mediastinal or hilar lymph nodes, noting limited sensitivity for the detection of hilar adenopathy on this noncontrast study. Lungs/Pleura: No pneumothorax. No pleural effusion. No acute consolidative airspace disease or lung masses. Mild centrilobular emphysema. A few scattered small left upper lobe pulmonary nodules, largest 3.9 mm in volume derived mean diameter (series 3/image 82). Upper abdomen: No acute abnormality. Musculoskeletal:  No aggressive appearing focal osseous lesions. IMPRESSION: 1. Lung-RADS 2, benign appearance or behavior. Continue annual screening with low-dose chest CT without contrast in 12 months. 2. Two-vessel coronary atherosclerosis. 3. Aortic Atherosclerosis (ICD10-I70.0) and Emphysema (ICD10-J43.9). Electronically Signed   By: Delbert PhenixJason A Poff M.D.   On: 11/22/2020 11:41    Assessment & Plan:   Tobacco abuse -     Ambulatory Referral for Lung Cancer Scre  Routine general medical examination at a health care facility- Exam completed, labs reviewed, vaccines reviewed, cancer screenings addressed, patient education was given. -     PSA; Future  Elevated LFTs- I encouraged him to avoid  alcohol intake. -     Hepatic function panel; Future -     Protime-INR; Future  Colon cancer screening -     Ambulatory referral to Gastroenterology  Hypertriglyceridemia- Will restart Vascepa. -     Lipid panel; Future -     Icosapent Ethyl; Take 2 capsules (2 g total) by mouth 2 (two) times daily.  Dispense: 360 capsule; Refill: 1  Gastroesophageal reflux disease without esophagitis -     CBC with Differential/Platelet; Future  Essential hypertension- His blood pressure is adequately well-controlled. -     Basic metabolic panel; Future -     CBC with Differential/Platelet; Future -     TSH; Future -     Urinalysis, Routine w reflex microscopic; Future -     Hepatic function panel; Future -     EKG 12-Lead  Acne vulgaris -  Minocycline HCl; Take 1 tablet (50 mg total) by mouth 2 (two) times daily for 14 days.  Dispense: 28 tablet; Refill: 0  Dyslipidemia, goal LDL below 70- Will start a statin for cardiovascular risk reduction. -     Rosuvastatin Calcium; Take 1 tablet (5 mg total) by mouth daily.  Dispense: 90 tablet; Refill: 1  Other orders -     LDL cholesterol, direct     Follow-up: Return in about 6 months (around 02/14/2023).  Sanda Lingerhomas Hazelle Woollard, MD

## 2022-08-16 ENCOUNTER — Encounter: Payer: Self-pay | Admitting: Internal Medicine

## 2022-08-16 MED ORDER — ROSUVASTATIN CALCIUM 5 MG PO TABS
5.0000 mg | ORAL_TABLET | Freq: Every day | ORAL | 1 refills | Status: DC
Start: 2022-08-16 — End: 2023-10-05

## 2022-08-16 MED ORDER — ICOSAPENT ETHYL 1 G PO CAPS
2.0000 g | ORAL_CAPSULE | Freq: Two times a day (BID) | ORAL | 1 refills | Status: DC
Start: 2022-08-16 — End: 2023-10-05

## 2022-10-01 NOTE — Progress Notes (Signed)
None

## 2022-10-13 ENCOUNTER — Encounter: Payer: Self-pay | Admitting: Gastroenterology

## 2022-10-24 ENCOUNTER — Encounter: Payer: Self-pay | Admitting: Gastroenterology

## 2022-10-24 ENCOUNTER — Ambulatory Visit (AMBULATORY_SURGERY_CENTER): Payer: 59

## 2022-10-24 VITALS — Ht 74.0 in | Wt 175.0 lb

## 2022-10-24 DIAGNOSIS — Z1211 Encounter for screening for malignant neoplasm of colon: Secondary | ICD-10-CM

## 2022-10-24 MED ORDER — NA SULFATE-K SULFATE-MG SULF 17.5-3.13-1.6 GM/177ML PO SOLN: 1.0000 | Freq: Once | ORAL | 0 refills | Status: AC

## 2022-10-24 NOTE — Progress Notes (Signed)
No egg or soy allergy known to patient  No issues known to pt with past sedation with any surgeries or procedures Patient denies ever being told they had issues or difficulty with intubation  No FH of Malignant Hyperthermia Pt is not on diet pills Pt is not on  home 02  Pt is not on blood thinners  Pt denies issues with constipation  No A fib or A flutter Have any cardiac testing pending--no  LOA: independent   Patient's chart reviewed by Cathlyn Parsons CNRA prior to previsit and patient appropriate for the [facility].  Previsit completed and red dot placed by patient's name on their procedure day (on provider's schedule).      PV completed. Prep reviewed with patient. Instructions sent via mychart and home address. Goodrx coupon for [facility] provided, Pt made aware that to obtain price reduction the coupon will have to be taken when the prep is picked up. Pt instructed to use [email or web address] or GoodRx for a price reduction on prep if needed

## 2022-11-11 ENCOUNTER — Encounter: Payer: 59 | Admitting: Gastroenterology

## 2022-11-29 ENCOUNTER — Other Ambulatory Visit: Payer: Self-pay | Admitting: *Deleted

## 2022-11-29 DIAGNOSIS — Z122 Encounter for screening for malignant neoplasm of respiratory organs: Secondary | ICD-10-CM

## 2022-11-29 DIAGNOSIS — Z87891 Personal history of nicotine dependence: Secondary | ICD-10-CM

## 2022-11-29 DIAGNOSIS — F17210 Nicotine dependence, cigarettes, uncomplicated: Secondary | ICD-10-CM

## 2022-11-30 ENCOUNTER — Encounter: Payer: Self-pay | Admitting: Gastroenterology

## 2022-11-30 ENCOUNTER — Ambulatory Visit (AMBULATORY_SURGERY_CENTER): Payer: 59 | Admitting: Gastroenterology

## 2022-11-30 VITALS — BP 137/89 | HR 83 | Temp 98.0°F | Resp 15 | Ht 74.0 in | Wt 175.0 lb

## 2022-11-30 DIAGNOSIS — Z1211 Encounter for screening for malignant neoplasm of colon: Secondary | ICD-10-CM | POA: Diagnosis not present

## 2022-11-30 DIAGNOSIS — D124 Benign neoplasm of descending colon: Secondary | ICD-10-CM

## 2022-11-30 DIAGNOSIS — D123 Benign neoplasm of transverse colon: Secondary | ICD-10-CM

## 2022-11-30 DIAGNOSIS — D122 Benign neoplasm of ascending colon: Secondary | ICD-10-CM

## 2022-11-30 MED ORDER — SODIUM CHLORIDE 0.9 % IV SOLN: 500.0000 mL | Freq: Once | INTRAVENOUS | Status: DC

## 2022-11-30 NOTE — Progress Notes (Signed)
Uneventful anesthetic. Report to pacu rn. Vss. Care resumed by rn. 

## 2022-11-30 NOTE — Progress Notes (Signed)
Pt's states no medical or surgical changes since previsit or office visit. 

## 2022-11-30 NOTE — Progress Notes (Signed)
History and Physical:  This patient presents for endoscopic testing for: Encounter Diagnosis  Name Primary?   Special screening for malignant neoplasms, colon Yes    Average risk for colorectal cancer.  First screening exam.  Patient denies chronic abdominal pain, rectal bleeding, constipation or diarrhea.   Patient is otherwise without complaints or active issues today.   Past Medical History: Past Medical History:  Diagnosis Date   ALLERGIC RHINITIS 12/05/2007   GERD 12/05/2007   HYPERLIPIDEMIA 12/05/2007   Hypertriglyceridemia 05/23/2011   TRANSAMINASES, SERUM, ELEVATED 03/08/2010   Unspecified disorder of kidney and ureter 03/12/2010     Past Surgical History: History reviewed. No pertinent surgical history.  Allergies: No Known Allergies  Outpatient Meds: Current Outpatient Medications  Medication Sig Dispense Refill   icosapent Ethyl (VASCEPA) 1 g capsule Take 2 capsules (2 g total) by mouth 2 (two) times daily. 360 capsule 1   Cholecalciferol 1.25 MG (50000 UT) capsule Take 1 capsule (50,000 Units total) by mouth once a week. (Patient not taking: Reported on 10/24/2022) 12 capsule 1   nebivolol (BYSTOLIC) 5 MG tablet Take 1 tablet (5 mg total) by mouth daily. (Patient not taking: Reported on 10/24/2022) 90 tablet 1   rosuvastatin (CRESTOR) 5 MG tablet Take 1 tablet (5 mg total) by mouth daily. (Patient not taking: Reported on 11/30/2022) 90 tablet 1   traMADol (ULTRAM) 50 MG tablet Take 1 tablet (50 mg total) by mouth 3 (three) times daily as needed. (Patient not taking: Reported on 10/24/2022) 30 tablet 0   Current Facility-Administered Medications  Medication Dose Route Frequency Provider Last Rate Last Admin   0.9 %  sodium chloride infusion  500 mL Intravenous Once Sherrilyn Rist, MD          ___________________________________________________________________ Objective   Exam:  BP (!) 151/90   Pulse 89   Temp 98 F (36.7 C)   Resp 16   Ht 6\' 2"  (1.88 m)    Wt 175 lb (79.4 kg)   SpO2 100%   BMI 22.47 kg/m   CV: regular , S1/S2 Resp: clear to auscultation bilaterally, normal RR and effort noted GI: soft, no tenderness, with active bowel sounds.   Assessment: Encounter Diagnosis  Name Primary?   Special screening for malignant neoplasms, colon Yes     Plan: Colonoscopy   The benefits and risks of the planned procedure were described in detail with the patient or (when appropriate) their health care proxy.  Risks were outlined as including, but not limited to, bleeding, infection, perforation, adverse medication reaction leading to cardiac or pulmonary decompensation, pancreatitis (if ERCP).  The limitation of incomplete mucosal visualization was also discussed.  No guarantees or warranties were given.  The patient is appropriate for an endoscopic procedure in the ambulatory setting.   - Amada Jupiter, MD

## 2022-11-30 NOTE — Progress Notes (Signed)
Called to room to assist during endoscopic procedure.  Patient ID and intended procedure confirmed with present staff. Received instructions for my participation in the procedure from the performing physician.  

## 2022-11-30 NOTE — Patient Instructions (Signed)
Resume previous diet and medications. Awaiting pathology results. Repeat Colonoscopy date to be determined based on pathology results.  Handouts provided on Colon polyps.  YOU HAD AN ENDOSCOPIC PROCEDURE TODAY AT [facility]:   Refer to the procedure report that was given to you for any specific questions about what was found during the examination.  If the procedure report does not answer your questions, please call your gastroenterologist to clarify.  If you requested that your care partner not be given the details of your procedure findings, then the procedure report has been included in a sealed envelope for you to review at your convenience later.  YOU SHOULD EXPECT: Some feelings of bloating in the abdomen. Passage of more gas than usual.  Walking can help get rid of the air that was put into your GI tract during the procedure and reduce the bloating. If you had a lower endoscopy (such as a colonoscopy or flexible sigmoidoscopy) you may notice spotting of blood in your stool or on the toilet paper. If you underwent a bowel prep for your procedure, you may not have a normal bowel movement for a few days.  Please Note:  You might notice some irritation and congestion in your nose or some drainage.  This is from the oxygen used during your procedure.  There is no need for concern and it should clear up in a day or so.  SYMPTOMS TO REPORT IMMEDIATELY:  Following lower endoscopy (colonoscopy or flexible sigmoidoscopy):  Excessive amounts of blood in the stool  Significant tenderness or worsening of abdominal pains  Swelling of the abdomen that is new, acute  Fever of 100F or higher  For urgent or emergent issues, a gastroenterologist can be reached at any hour by calling [phone]. Do not use MyChart messaging for urgent concerns.    DIET:  We do recommend a small meal at first, but then you may proceed to your regular diet.  Drink plenty of fluids but you should avoid  alcoholic beverages for 24 hours.  ACTIVITY:  You should plan to take it easy for the rest of today and you should NOT DRIVE or use heavy machinery until tomorrow (because of the sedation medicines used during the test).    FOLLOW UP: Our staff will call the number listed on your records the next business day following your procedure.  We will call around 7:15- 8:00 am to check on you and address any questions or concerns that you may have regarding the information given to you following your procedure. If we do not reach you, we will leave a message.     If any biopsies were taken you will be contacted by phone or by letter within the next 1-3 weeks.  Please call us at [phone] if you have not heard about the biopsies in 3 weeks.    SIGNATURES/CONFIDENTIALITY: You and/or your care partner have signed paperwork which will be entered into your electronic medical record.  These signatures attest to the fact that that the information above on your After Visit Summary has been reviewed and is understood.  Full responsibility of the confidentiality of this discharge information lies with you and/or your care-partner.

## 2022-11-30 NOTE — Op Note (Signed)
[facility] Patient Name: Philip Zimmerman Procedure Date: 11/30/2022 2:35 PM MRN: [id number] Endoscopist: Sherilyn Cooter L. Myrtie Neither , MD, [id number] Age: 57 Referring MD:  Date of Birth: [birth year] Gender: Male Account #: [id number] Procedure:                Colonoscopy Indications:              Screening for colorectal malignant neoplasm, This                            is the patient's first colonoscopy Medicines:                Monitored Anesthesia Care Procedure:                Pre-Anesthesia Assessment:                           - Prior to the procedure, a History and Physical                            was performed, and patient medications and                            allergies were reviewed. The patient's tolerance of                            previous anesthesia was also reviewed. The risks                            and benefits of the procedure and the sedation                            options and risks were discussed with the patient.                            All questions were answered, and informed consent                            was obtained. Prior Anticoagulants: The patient has                            taken no anticoagulant or antiplatelet agents. ASA                            Grade Assessment: II - A patient with mild systemic                            disease. After reviewing the risks and benefits,                            the patient was deemed in satisfactory condition to                            undergo the procedure.  After obtaining informed consent, the colonoscope                            was passed under direct vision. Throughout the                            procedure, the patient's blood pressure, pulse, and                            oxygen saturations were monitored continuously. The                            CF [id number] #[id number] was introduced through the anus                            and advanced to the  the cecum, identified by                            appendiceal orifice and ileocecal valve. The                            colonoscopy was somewhat difficult due to a                            redundant colon. Successful completion of the                            procedure was aided by using manual pressure and                            straightening and shortening the scope to obtain                            bowel loop reduction. The patient tolerated the                            procedure well. The quality of the bowel                            preparation was excellent. The ileocecal valve,                            appendiceal orifice, and rectum were photographed. Scope In: 3:01:06 PM Scope Out: 3:20:58 PM Scope Withdrawal Time: 0 hours 14 minutes 6 seconds  Total Procedure Duration: 0 hours 19 minutes 52 seconds  Findings:                 The perianal and digital rectal examinations were                            normal.                           Repeat examination of right colon under NBI  performed.                           Three sessile and semi-sessile polyps were found in                            the descending colon, transverse colon and                            ascending colon. The polyps were 3 to 8 mm in size.                            These polyps were removed with a cold snare.                            Resection and retrieval were complete.                           The exam was otherwise without abnormality on                            direct and retroflexion views. Complications:            No immediate complications. Estimated Blood Loss:     Estimated blood loss was minimal. Impression:               - Three 3 to 8 mm polyps in the descending colon,                            in the transverse colon and in the ascending colon,                            removed with a cold snare. Resected and retrieved.                            - The examination was otherwise normal on direct                            and retroflexion views. Recommendation:           - Patient has a contact number available for                            emergencies. The signs and symptoms of potential                            delayed complications were discussed with the                            patient. Return to normal activities tomorrow.                            Written discharge instructions were provided to the  patient.                           - Resume previous diet.                           - Continue present medications.                           - Await pathology results.                           - Repeat colonoscopy is recommended for                            surveillance. The colonoscopy date will be                            determined after pathology results from today's                            exam become available for review. [name] L. Myrtie Neither, MD 11/30/2022 3:24:40 PM This report has been signed electronically.

## 2022-12-01 ENCOUNTER — Telehealth: Payer: Self-pay

## 2022-12-01 NOTE — Telephone Encounter (Signed)
  Follow up Call-     11/30/2022    2:24 PM  Call back number  Post procedure Call Back phone  # [phone]  Permission to leave phone message Yes     Follow up call, LVM.

## 2022-12-14 ENCOUNTER — Encounter: Payer: Self-pay | Admitting: Gastroenterology

## 2022-12-21 ENCOUNTER — Inpatient Hospital Stay: Admission: RE | Admit: 2022-12-21 | Payer: 59 | Source: Ambulatory Visit

## 2023-01-05 ENCOUNTER — Ambulatory Visit
Admission: RE | Admit: 2023-01-05 | Discharge: 2023-01-05 | Disposition: A | Discharge status: Home / Self Care | Payer: 59 | Source: Ambulatory Visit | Attending: Acute Care | Admitting: Acute Care

## 2023-01-05 DIAGNOSIS — Z87891 Personal history of nicotine dependence: Secondary | ICD-10-CM

## 2023-01-05 DIAGNOSIS — F17210 Nicotine dependence, cigarettes, uncomplicated: Secondary | ICD-10-CM

## 2023-01-05 DIAGNOSIS — Z122 Encounter for screening for malignant neoplasm of respiratory organs: Secondary | ICD-10-CM

## 2023-01-16 ENCOUNTER — Other Ambulatory Visit: Payer: Self-pay

## 2023-01-16 DIAGNOSIS — F17210 Nicotine dependence, cigarettes, uncomplicated: Secondary | ICD-10-CM

## 2023-01-16 DIAGNOSIS — Z87891 Personal history of nicotine dependence: Secondary | ICD-10-CM

## 2023-01-16 DIAGNOSIS — Z122 Encounter for screening for malignant neoplasm of respiratory organs: Secondary | ICD-10-CM

## 2023-10-05 ENCOUNTER — Ambulatory Visit
Admission: EM | Admit: 2023-10-05 | Discharge: 2023-10-05 | Disposition: A | Discharge status: Home / Self Care | Attending: Nurse Practitioner | Admitting: Nurse Practitioner

## 2023-10-05 ENCOUNTER — Ambulatory Visit: Payer: Self-pay

## 2023-10-05 DIAGNOSIS — M5431 Sciatica, right side: Secondary | ICD-10-CM

## 2023-10-05 DIAGNOSIS — M546 Pain in thoracic spine: Secondary | ICD-10-CM

## 2023-10-05 DIAGNOSIS — R030 Elevated blood-pressure reading, without diagnosis of hypertension: Secondary | ICD-10-CM

## 2023-10-05 MED ORDER — DEXAMETHASONE SODIUM PHOSPHATE 10 MG/ML IJ SOLN
10.0000 mg | Freq: Once | INTRAMUSCULAR | Status: AC
  Administered 2023-10-05: 10 mg via INTRAMUSCULAR

## 2023-10-05 MED ORDER — METHYLPREDNISOLONE 4 MG PO TBPK
ORAL_TABLET | ORAL | 0 refills | Status: DC
Start: 2023-10-05 — End: 2023-12-14

## 2023-10-05 MED ORDER — NAPROXEN 500 MG PO TABS: 500.0000 mg | ORAL_TABLET | Freq: Two times a day (BID) | ORAL | 0 refills | Status: DC

## 2023-10-05 MED ORDER — CYCLOBENZAPRINE HCL 10 MG PO TABS: 10.0000 mg | ORAL_TABLET | Freq: Every day | ORAL | 0 refills | Status: DC

## 2023-10-05 MED ORDER — KETOROLAC TROMETHAMINE 60 MG/2ML IM SOLN
60.0000 mg | Freq: Once | INTRAMUSCULAR | Status: AC
  Administered 2023-10-05: 60 mg via INTRAMUSCULAR

## 2023-10-05 MED ORDER — METHOCARBAMOL 500 MG PO TABS: 500.0000 mg | ORAL_TABLET | Freq: Every morning | ORAL | 0 refills | Status: DC

## 2023-10-05 NOTE — ED Triage Notes (Signed)
 Patient work out daily at [employer] Patient denies any fever or burning sensation to his legs.

## 2023-10-05 NOTE — ED Provider Notes (Signed)
 [facility]    CSN: [id number] Arrival date & time: 10/05/23  1343      History   Chief Complaint Chief Complaint  Patient presents with   Leg Pain   Back Pain    HPI Philip Hinde. is a 58 y.o. male.   Philip Breit. is a 58 y.o. male that presents with back pain and sciatica symptoms. The patient reports a two-year history of back pain that initially started while playing golf in [place]  in 2023. The current episode began after playing golf on Sunday, with a significant worsening this morning when the patient stretched and their legs collapsed. The pain is located in the center of the back and radiates down the back of both legs. The patient describes a "jolt" in their back on Monday morning following Sunday's golf game. They report numbness and tingling sensation down their legs, extending to the back of the thighs and up to the back of the knees. The patient notes that there is no specific aggravating factor, stating there is "no rhyme or reason" to the pain. They mention feeling mild discomfort sometimes while working out, particularly when on the bench press. This morning, the patient applied a heating pad and took 800 mg of Motrin, but these interventions did not provide relief. The patient reports that their legs "locked up" and they "went straight to the ground" during this morning's episode. They deny any pain in the groin or genitals, and there is no reported blood in the urine. The patient exercises regularly, working out three times a week.   The following portions of the patient's history were reviewed and updated as appropriate: allergies, current medications, past family history, past medical history, past social history, past surgical history, and problem list.    Past Medical History:  Diagnosis Date   ALLERGIC RHINITIS 12/05/2007   GERD 12/05/2007   HYPERLIPIDEMIA 12/05/2007   Hypertriglyceridemia 05/23/2011   TRANSAMINASES, SERUM, ELEVATED  03/08/2010   Unspecified disorder of kidney and ureter 03/12/2010    Patient Active Problem List   Diagnosis Date Noted   Acne vulgaris 08/15/2022   Vitamin D deficiency disease 08/07/2020   Tobacco abuse 08/06/2020   Colon cancer screening 05/06/2019   Elevated LFTs 07/12/2018   Essential hypertension 07/11/2018   IBS (irritable bowel syndrome) 06/30/2015   Hypertriglyceridemia 05/23/2011   Routine general medical examination at a health care facility 01/13/2011   Allergic rhinitis 12/05/2007   GERD 12/05/2007    History reviewed. No pertinent surgical history.     Home Medications    Prior to Admission medications   Medication Sig Start Date End Date Taking? Authorizing Provider  cyclobenzaprine (FLEXERIL) 10 MG tablet Take 1 tablet (10 mg total) by mouth at bedtime. 10/05/23  Yes Maryruth Sol, FNP  methocarbamol (ROBAXIN) 500 MG tablet Take 1 tablet (500 mg total) by mouth every morning. 10/05/23  Yes Maryruth Sol, FNP  methylPREDNISolone  (MEDROL  DOSEPAK) 4 MG TBPK tablet Take as directed, START IN THE AM OF 10/06/23 10/05/23  Yes Maryruth Sol, FNP  naproxen (NAPROSYN) 500 MG tablet Take 1 tablet (500 mg total) by mouth 2 (two) times daily with a meal. 10/05/23  Yes Maryruth Sol, FNP    Family History Family History  Problem Relation Age of Onset   Heart disease Father    Hypertension Father    Hyperlipidemia Father    Gout Father    Cancer Maternal Grandmother        breast cancer  Diabetes Maternal Grandmother    Esophageal cancer Maternal Grandfather        thinks not sure   Cancer Maternal Grandfather        throat cancer   Colon cancer Neg Hx    Rectal cancer Neg Hx    Stomach cancer Neg Hx     Social History Social History   Tobacco Use   Smoking status: Every Day    Current packs/day: 0.50    Average packs/day: 0.5 packs/day for 25.0 years (12.5 ttl pk-yrs)    Types: Cigarettes   Smokeless tobacco: Never  Vaping Use   Vaping  status: Former  Substance Use Topics   Alcohol use: Yes    Alcohol/week: 28.0 standard drinks of alcohol    Types: 28 Cans of beer per week    Comment: beer daily   Drug use: Yes    Types: Marijuana     Allergies   Patient has no known allergies.   Review of Systems Review of Systems  Gastrointestinal:  Negative for nausea and vomiting.  Genitourinary:  Negative for dysuria and hematuria.  Musculoskeletal:  Positive for back pain.  Neurological:  Positive for weakness and numbness.  All other systems reviewed and are negative.    Physical Exam Triage Vital Signs ED Triage Vitals  Encounter Vitals Group     BP 10/05/23 1358 (!) 162/94     Systolic BP Percentile --      Diastolic BP Percentile --      Pulse Rate 10/05/23 1358 97     Resp 10/05/23 1358 19     Temp 10/05/23 1358 (!) 97.5 F (36.4 C)     Temp Source 10/05/23 1358 Oral     SpO2 10/05/23 1358 97 %     Weight --      Height --      Head Circumference --      Peak Flow --      Pain Score 10/05/23 1408 10     Pain Loc --      Pain Education --      Exclude from Growth Chart --    No data found.  Updated Vital Signs BP (!) 162/94 (BP Location: Left Arm)   Pulse 97   Temp (!) 97.5 F (36.4 C) (Oral)   Resp 19   SpO2 97%   Visual Acuity Right Eye Distance:   Left Eye Distance:   Bilateral Distance:    Right Eye Near:   Left Eye Near:    Bilateral Near:     Physical Exam Vitals reviewed.  Constitutional:      General: He is not in acute distress.    Appearance: Normal appearance. He is not ill-appearing, toxic-appearing or diaphoretic.  HENT:     Head: Normocephalic.     Mouth/Throat:     Mouth: Mucous membranes are moist.  Cardiovascular:     Rate and Rhythm: Normal rate and regular rhythm.  Pulmonary:     Effort: Pulmonary effort is normal.     Breath sounds: Normal breath sounds.  Abdominal:     Palpations: Abdomen is soft.     Tenderness: There is no right CVA tenderness or left  CVA tenderness.  Musculoskeletal:        General: Normal range of motion.     Cervical back: Normal, normal range of motion and neck supple.     Thoracic back: Normal.     Lumbar back: Tenderness present. No swelling, deformity, lacerations  or spasms. Normal range of motion. Negative right straight leg raise test and negative left straight leg raise test.  Skin:    General: Skin is warm and dry.  Neurological:     General: No focal deficit present.     Mental Status: He is alert and oriented to person, place, and time.     Cranial Nerves: Cranial nerves 2-12 are intact.     Sensory: Sensation is intact.     Motor: Motor function is intact. No weakness.     Coordination: Coordination is intact.     Gait: Gait is intact.      UC Treatments / Results  Labs (all labs ordered are listed, but only abnormal results are displayed) Labs Reviewed - No data to display  EKG   Radiology No results found.  Procedures Procedures (including critical care time)  Medications Ordered in UC Medications  dexamethasone (DECADRON) injection 10 mg (10 mg Intramuscular Given 10/05/23 1514)  ketorolac (TORADOL) injection 60 mg (60 mg Intramuscular Given 10/05/23 1514)    Initial Impression / Assessment and Plan / UC Course  I have reviewed the triage vital signs and the nursing notes.  Pertinent labs & imaging results that were available during my care of the patient were reviewed by me and considered in my medical decision making (see chart for details).     Patient presents with central back pain radiating down both legs, consistent with sciatica, initially triggered by a golf injury two years ago and progressively worsening. Symptoms were recently exacerbated after golfing on Sunday, followed by a jolt in the back Monday morning. Patient reports numbness, tingling, and a leg collapse episode while stretching today. Pain is aggravated by specific movements and activities, including  weightlifting. No groin or genital pain, and no hematuria noted. Presentation is suggestive of degenerative disc disease in the lumbar region, with inflammation and sciatic nerve involvement. Patient received intramuscular steroid and Toradol injections for inflammation and pain relief. Oral anti-inflammatory medication and a muscle relaxer were prescribed. Supportive care includes heating pad use and initiating mobility and flexibility exercises after a few days. Warm-up and post-exercise ice application were advised. Orthopedic referral may be considered if symptoms persist or recur. Patient instructed to follow up with PCP if symptoms do not improve and to seek emergency care if new urinary symptoms or leg weakness develop.  Patient also noted to have an elevated blood pressure and admits to not currently taking prescribed daily medication. Although not urgent, follow-up with primary care for hypertension management is recommended. Patient advised to seek emergency care if symptoms such as blurred vision, severe headache, vomiting, or significantly elevated blood pressure occur.  Today's evaluation has revealed no signs of a dangerous process. Discussed diagnosis with patient and/or guardian. Patient and/or guardian aware of their diagnosis, possible red flag symptoms to watch out for and need for close follow up. Patient and/or guardian understands verbal and written discharge instructions. Patient and/or guardian comfortable with plan and disposition.  Patient and/or guardian has a clear mental status at this time, good insight into illness (after discussion and teaching) and has clear judgment to make decisions regarding their care  Documentation was completed with the aid of voice recognition software. Transcription may contain typographical errors.    Final Clinical Impressions(s) / UC Diagnoses   Final diagnoses:  Bilateral thoracic back pain, unspecified chronicity  Bilateral sciatica   Elevated blood pressure reading     Discharge Instructions      You were  seen today for back pain that radiates down the back of your legs, which is consistent with sciatica and likely related to underlying degenerative disc disease in the lower thoracic spine. You are also experiencing numbness and tingling down the legs, which are common symptoms of nerve involvement.  Today in the clinic, you received a steroid injection to help reduce inflammation and a Toradol injection to provide immediate pain relief. You have also been prescribed an oral anti-inflammatory medication to be taken twice a day with food, muscle relaxers and oral steroids. These medications are intended to help reduce pain, inflammation, and muscle tightness that may be contributing to your symptoms. While taking these medications, do not use over-the-counter anti-inflammatories such as aspirin, Motrin, ibuprofen, or Aleve, as this may increase the risk of side effects. If needed, you may take Tylenol  (acetaminophen ) 1000 mg every six hours for additional pain relief. This equals two 500 mg tablets at a time. Be careful not to take more than 4000 mg of Tylenol  in a 24-hour period.  To support your recovery, it is recommended that you use a heating pad for approximately 10-20 minutes at a time, especially over the next several days. After a few days, you may begin gentle stretching and mobility exercises to improve the flexibility and function of your hips and back. Please refer to the attached sciatica rehab exercises. Prior to engaging in any physical activity such as weightlifting, be sure to warm up properly. After exercise, apply ice to the affected area for about 20 minutes to reduce any post-activity inflammation.  If your symptoms persist or return after treatment, a referral to an orthopedic specialist may be necessary for further evaluation, including imaging or physical therapy. Seek immediate medical attention in the  emergency room if you develop new or worsening symptoms, such as difficulty controlling your bladder or bowels, unexpected urination, or increasing leg weakness, as these may indicate a more serious condition.  Additionally, your blood pressure was elevated today. Although it is not at an emergency level, you indicated that you are not currently taking the blood pressure medication. It is important to follow up with your primary care provider to discuss your blood pressure and resume appropriate treatment. If you experience symptoms such as blurred vision, severe headache, vomiting, or extremely high blood pressure, go to the emergency room for further evaluation.  If you have any questions or concerns before your follow-up, do not hesitate to contact the clinic.    ED Prescriptions     Medication Sig Dispense Auth. Provider   naproxen (NAPROSYN) 500 MG tablet Take 1 tablet (500 mg total) by mouth 2 (two) times daily with a meal. 20 tablet Maryruth Sol, FNP   methocarbamol (ROBAXIN) 500 MG tablet Take 1 tablet (500 mg total) by mouth every morning. 10 tablet Maryruth Sol, FNP   cyclobenzaprine (FLEXERIL) 10 MG tablet Take 1 tablet (10 mg total) by mouth at bedtime. 10 tablet Maryruth Sol, FNP   methylPREDNISolone  (MEDROL  DOSEPAK) 4 MG TBPK tablet Take as directed, START IN THE AM OF 10/06/23 21 tablet Maryruth Sol, FNP      PDMP not reviewed this encounter.   Maryruth Sol, [place] 10/05/23 1530

## 2023-10-05 NOTE — Telephone Encounter (Signed)
  Chief Complaint: back pain Symptoms: mid back pain radiates down both legs, numbness and tingling in both legs Frequency: x 6 months, worsened over the past couple weeks and severe today Pertinent Negatives: Patient denies loss of bowel or bladder control Disposition: [] ED /[x] Urgent Care (no appt availability in office) / [] Appointment(In office/virtual)/ []  [facility]/ [] Home Care/ [] Refused Recommended Disposition /[] [facility]/ []  Follow-up with PCP Additional Notes: Patient states he stood up to stretch and fell down this morning. Patient states he played golf this weekend and thinks that flared up his back pain. He states he worked out yesterday morning as well. Patient states this morning he went to stretch his back and he fell to the floor due to the pain and numbness that shot down his legs. Patient took 800mg  ibuprofen this morning and states it did not help his pain. Called [facility] and confirmed no available appointments at the office, patient sent to urgent care.  Copied from CRM [id number]. Topic: Clinical - Red Word Triage >> Oct 05, 2023 11:21 AM Howard Macho wrote: Reason for [id number] called stating he is having back spasms affecting his balance.patient stated it started six months ago and now it is getting bad. Patient stated it is from the center of his back to the back of his calf Reason for Disposition  [1] SEVERE back pain (e.g., excruciating, unable to do any normal activities) AND [2] not improved 2 hours after pain medicine  Answer Assessment - Initial Assessment Questions 1. ONSET: "When did the pain begin?"      6 months, worsened  over the last 2 weeks.  2. LOCATION: "Where does it hurt?" (upper, mid or lower back)     Center/middle.  3. SEVERITY: "How bad is the pain?"  (e.g., Scale 1-10; mild, moderate, or severe)   - MILD (1-3): Doesn't interfere with normal activities.    - MODERATE (4-7): Interferes with normal activities or awakens from  sleep.    - SEVERE (8-10): Excruciating pain, unable to do any normal activities.      Patient unable to assess pain level.   4. PATTERN: "Is the pain constant?" (e.g., yes, no; constant, intermittent)      Constant today.   5. RADIATION: "Does the pain shoot into your legs or somewhere else?"     Radiates to back of his knees/calves on both sides.  6. CAUSE:  "What do you think is causing the back pain?"      He states 2 years ago, playing golf he would feel a twinge in his back and it has progressively gotten worsen.  7. BACK OVERUSE:  "Any recent lifting of heavy objects, strenuous work or exercise?"     He states he works out and plays golf. He states the pain   8. MEDICINES: "What have you taken so far for the pain?" (e.g., nothing, acetaminophen , NSAIDS)     800mg  ibuprofen 2 hours ago.  9. NEUROLOGIC SYMPTOMS: "Do you have any weakness, numbness, or problems with bowel/bladder control?"     Numbness and tingling radiates down both legs.  10. OTHER SYMPTOMS: "Do you have any other symptoms?" (e.g., fever, abdomen pain, burning with urination, blood in urine)       No.  11. PREGNANCY: "Is there any chance you are pregnant?" "When was your last menstrual period?"       N/A.  Protocols used: Back Pain-A-AH

## 2023-10-05 NOTE — Discharge Instructions (Addendum)
 You were seen today for back pain that radiates down the back of your legs, which is consistent with sciatica and likely related to underlying degenerative disc disease in the lower thoracic spine. You are also experiencing numbness and tingling down the legs, which are common symptoms of nerve involvement.  Today in the clinic, you received a steroid injection to help reduce inflammation and a Toradol  injection to provide immediate pain relief. You have also been prescribed an oral anti-inflammatory medication to be taken twice a day with food, muscle relaxers and oral steroids. These medications are intended to help reduce pain, inflammation, and muscle tightness that may be contributing to your symptoms. While taking these medications, do not use over-the-counter anti-inflammatories such as aspirin, Motrin, ibuprofen, or Aleve , as this may increase the risk of side effects. If needed, you may take Tylenol  (acetaminophen ) 1000 mg every six hours for additional pain relief. This equals two 500 mg tablets at a time. Be careful not to take more than 4000 mg of Tylenol  in a 24-hour period.  To support your recovery, it is recommended that you use a heating pad for approximately 10-20 minutes at a time, especially over the next several days. After a few days, you may begin gentle stretching and mobility exercises to improve the flexibility and function of your hips and back. Please refer to the attached sciatica rehab exercises. Prior to engaging in any physical activity such as weightlifting, be sure to warm up properly. After exercise, apply ice to the affected area for about 20 minutes to reduce any post-activity inflammation.  If your symptoms persist or return after treatment, a referral to an orthopedic specialist may be necessary for further evaluation, including imaging or physical therapy. Seek immediate medical attention in the emergency room if you develop new or worsening symptoms, such as difficulty  controlling your bladder or bowels, unexpected urination, or increasing leg weakness, as these may indicate a more serious condition.  Additionally, your blood pressure was elevated today. Although it is not at an emergency level, you indicated that you are not currently taking the blood pressure medication. It is important to follow up with your primary care provider to discuss your blood pressure and resume appropriate treatment. If you experience symptoms such as blurred vision, severe headache, vomiting, or extremely high blood pressure, go to the emergency room for further evaluation.  If you have any questions or concerns before your follow-up, do not hesitate to contact the clinic.

## 2023-10-05 NOTE — ED Triage Notes (Signed)
 Patient presents with lower back pain that radiates down to his legs. Patient states this is a recurrent pain. Patient states his legs lock-up this morning.

## 2023-11-07 DIAGNOSIS — R52 Pain, unspecified: Secondary | ICD-10-CM | POA: Insufficient documentation

## 2023-12-14 ENCOUNTER — Ambulatory Visit: Admitting: Internal Medicine

## 2023-12-14 ENCOUNTER — Encounter: Payer: Self-pay | Admitting: Internal Medicine

## 2023-12-14 ENCOUNTER — Ambulatory Visit: Payer: Self-pay

## 2023-12-14 VITALS — BP 140/82 | HR 97 | Temp 98.0°F | Ht 74.0 in | Wt 182.6 lb

## 2023-12-14 DIAGNOSIS — G8929 Other chronic pain: Secondary | ICD-10-CM | POA: Diagnosis not present

## 2023-12-14 DIAGNOSIS — Z720 Tobacco use: Secondary | ICD-10-CM

## 2023-12-14 DIAGNOSIS — I10 Essential (primary) hypertension: Secondary | ICD-10-CM

## 2023-12-14 DIAGNOSIS — M5441 Lumbago with sciatica, right side: Secondary | ICD-10-CM

## 2023-12-14 DIAGNOSIS — M5442 Lumbago with sciatica, left side: Secondary | ICD-10-CM

## 2023-12-14 DIAGNOSIS — E559 Vitamin D deficiency, unspecified: Secondary | ICD-10-CM

## 2023-12-14 NOTE — Assessment & Plan Note (Signed)
 Pt counsled to quit, pt not ready

## 2023-12-14 NOTE — Assessment & Plan Note (Signed)
 Last vitamin D Lab Results  Component Value Date   VD25OH 9.01 (L) 08/06/2020   Low, to start oral replacement

## 2023-12-14 NOTE — Patient Instructions (Signed)
 Please continue all other medications as before, and refills have been done if requested.  Please have the pharmacy call with any other refills you may need.  Please keep your appointments with your specialists as you may have planned  You will be contacted regarding the referral for: MRI LS Spine

## 2023-12-14 NOTE — Assessment & Plan Note (Signed)
 BP Readings from Last 3 Encounters:  12/14/23 (!) 140/82  10/05/23 (!) 162/94  11/30/22 137/89   Uncontrolled , likely reactive, declines med change today, pt to continue low salt, diet , exercise, wt control

## 2023-12-14 NOTE — Telephone Encounter (Signed)
 FYI Only or Action Required?: FYI only for provider.  Patient was last seen in primary care on 08/15/2022 by Joshua Debby CROME, MD.  Called Nurse Triage reporting Leg Pain.  Symptoms began several months ago.  Interventions attempted: OTC medications: Ibuprofen.  Symptoms are: gradually worsening.  Triage Disposition: See HCP Within 4 Hours (Or PCP Triage)  Patient/caregiver understands and will follow disposition?: Yes       Copied from CRM [id number]. Topic: Clinical - Red Word Triage >> Dec 14, 2023  1:37 PM Shereese L wrote: [facility] that prompted transfer to Nurse Triage: Tingling feeling in both feet Lower back pain (Sciatica )       Reason for Disposition  [1] Pain radiates into the thigh or further down the leg AND [2] both legs  Answer Assessment - Initial Assessment Questions 1. ONSET: When did the pain begin? (e.g., minutes, hours, days)     Several months ago  2. LOCATION: Where does it hurt? (upper, mid or lower back)     Lower back  3. SEVERITY: How bad is the pain?  (e.g., Scale 1-10; mild, moderate, or severe)     7/10 4. PATTERN: Is the pain constant? (e.g., yes, no; constant, intermittent)      Constant  5. RADIATION: Does the pain shoot into your legs or somewhere else?     Bilateral legs  6. CAUSE:  What do you think is causing the back pain?      Recently diagnosed with sciatica  7. BACK OVERUSE:  Any recent lifting of heavy objects, strenuous work or exercise?     No 8. MEDICINES: What have you taken so far for the pain? (e.g., nothing, acetaminophen , NSAIDS)     Ibuprofen  9. NEUROLOGIC SYMPTOMS: Do you have any weakness, numbness, or problems with bowel/bladder control?     Mild numbness and cold sensation in toes bilaterally  10. OTHER SYMPTOMS: Do you have any other symptoms? (e.g., fever, abdomen pain, burning with urination, blood in urine)       No  Protocols used: Back Pain-A-AH

## 2023-12-14 NOTE — Assessment & Plan Note (Signed)
 3 mo gradually worsening now chronic persistent with flares of bilat sciatica, also with new numbness to feet and recent falls x 2 with leg weakness. PT no help.  Now for LS Spine MRI, r/o spinal stenosis or DDD DJD with neuroforaminal stenosis

## 2023-12-14 NOTE — Progress Notes (Signed)
 Patient ID: Philip Vanhise., male   DOB: 1966-01-23, 58 y.o.   MRN: [id number]        Chief Complaint: follow up 3 month lower back pain in midline now with bilateral leg pain and weakness with fall x 2, htn, low vit d       HPI:  Philip Whilden. is a 58 y.o. male here with c/o mod to occasionally severe lower back pain, began with playing golf may 30 and felt a significant twinge with twisting the torso with the swing; No better with flexeril , robaxin  medrol  pack, or naproxyn    Since then has only gotten gradually worse, now with daily pain with occasional severe episodes such as even taking off the shirt, which leads to acute pain to both legs, and now numbness to both feet, and fallen x 2 where the knees just buckled from the weakness.  No injury so far   Pt was seen at [facility] 2 mo ago and completed PT but actually seemed to make it worse.  Pt denies chest pain, increased sob or doe, wheezing, orthopnea, PND, increased LE swelling, palpitations, dizziness or syncope.   Pt denies polydipsia, polyuria, or new focal neuro s/s.          Wt Readings from Last 3 Encounters:  12/14/23 182 lb 9.6 oz (82.8 kg)  11/30/22 175 lb (79.4 kg)  10/24/22 175 lb (79.4 kg)   BP Readings from Last 3 Encounters:  12/14/23 (!) 140/82  10/05/23 (!) 162/94  11/30/22 137/89         Past Medical History:  Diagnosis Date   ALLERGIC RHINITIS 12/05/2007   GERD 12/05/2007   HYPERLIPIDEMIA 12/05/2007   Hypertriglyceridemia 05/23/2011   TRANSAMINASES, SERUM, ELEVATED 03/08/2010   Unspecified disorder of kidney and ureter 03/12/2010   History reviewed. No pertinent surgical history.  reports that he has been smoking cigarettes. He has a 12.5 pack-year smoking history. He has never used smokeless tobacco. He reports current alcohol use of about 28.0 standard drinks of alcohol per week. He reports current drug use. Drug: Marijuana. family history includes Cancer in his maternal grandfather and maternal grandmother;  Diabetes in his maternal grandmother; Esophageal cancer in his maternal grandfather; Gout in his father; Heart disease in his father; Hyperlipidemia in his father; Hypertension in his father. No Known Allergies Current Outpatient Medications on File Prior to Visit  Medication Sig Dispense Refill   naproxen  (NAPROSYN ) 500 MG tablet Take 1 tablet (500 mg total) by mouth 2 (two) times daily with a meal. 20 tablet 0   cyclobenzaprine  (FLEXERIL ) 10 MG tablet Take 1 tablet (10 mg total) by mouth at bedtime. (Patient not taking: Reported on 12/14/2023) 10 tablet 0   methocarbamol  (ROBAXIN ) 500 MG tablet Take 1 tablet (500 mg total) by mouth every morning. (Patient not taking: Reported on 12/14/2023) 10 tablet 0   methylPREDNISolone  (MEDROL  DOSEPAK) 4 MG TBPK tablet Take as directed, START IN THE AM OF 10/06/23 (Patient not taking: Reported on 12/14/2023) 21 tablet 0   No current facility-administered medications on file prior to visit.        ROS:  All others reviewed and negative.  Objective        PE:  BP (!) 140/82   Pulse 97   Temp 98 F (36.7 C)   Ht 6' 2 (1.88 m)   Wt 182 lb 9.6 oz (82.8 kg)   SpO2 99%   BMI 23.44 kg/m  Constitutional: Pt appears in NAD               HENT: Head: NCAT.                Right Ear: External ear normal.                 Left Ear: External ear normal.                Eyes: . Pupils are equal, round, and reactive to light. Conjunctivae and EOM are normal               Nose: without d/c or deformity               Neck: Neck supple. Gross normal ROM               Cardiovascular: Normal rate and regular rhythm.                 Pulmonary/Chest: Effort normal and breath sounds without rales or wheezing.                Abd:  Soft, NT, ND, + BS, no organomegaly               Neurological: Pt is alert. At baseline orientation, motor to legs intact today, has tender in midline mid lumbar spine with convexity noted               Skin: Skin is warm. No rashes,  no other new lesions, LE edema - none               Psychiatric: Pt behavior is normal without agitation   Micro: none  Cardiac tracings I have personally interpreted today:  none  Pertinent Radiological findings (summarize): none   Lab Results  Component Value Date   WBC 9.3 08/15/2022   HGB 15.0 08/15/2022   HCT 43.4 08/15/2022   PLT 214.0 08/15/2022   GLUCOSE 93 08/15/2022   CHOL 210 (H) 08/15/2022   TRIG (H) 08/15/2022    471.0 Triglyceride is over 400; calculations on Lipids are invalid.   HDL 54.50 08/15/2022   LDLDIRECT 78.0 08/15/2022   LDLCALC 67 08/21/2013   ALT 32 08/15/2022   AST 25 08/15/2022   NA 138 08/15/2022   K 4.3 08/15/2022   CL 102 08/15/2022   CREATININE 1.03 08/15/2022   BUN 12 08/15/2022   CO2 27 08/15/2022   TSH 2.99 08/15/2022   PSA 0.61 08/15/2022   INR 1.1 (H) 08/15/2022   Assessment/Plan:  Philip Dauber. is a 58 y.o. Black or African American [2] male with  has a past medical history of ALLERGIC RHINITIS (12/05/2007), GERD (12/05/2007), HYPERLIPIDEMIA (12/05/2007), Hypertriglyceridemia (05/23/2011), TRANSAMINASES, SERUM, ELEVATED (03/08/2010), and Unspecified disorder of kidney and ureter (03/12/2010).  Vitamin D deficiency disease Last vitamin D Lab Results  Component Value Date   VD25OH 9.01 (L) 08/06/2020   Low, to start oral replacement   Tobacco abuse Pt counsled to quit, pt not ready  Essential hypertension BP Readings from Last 3 Encounters:  12/14/23 (!) 140/82  10/05/23 (!) 162/94  11/30/22 137/89   Uncontrolled , likely reactive, declines med change today, pt to continue low salt, diet , exercise, wt control   Chronic midline low back pain with bilateral sciatica 3 mo gradually worsening now chronic persistent with flares of bilat sciatica, also with new numbness to feet and recent falls x 2 with leg weakness. PT no help.  Now for LS Spine MRI, r/o spinal stenosis or DDD DJD with neuroforaminal stenosis  Followup:  Return if symptoms worsen or fail to improve.  Philip Rush, MD 12/14/2023 4:18 PM [facility]

## 2023-12-15 ENCOUNTER — Telehealth: Payer: Self-pay | Admitting: Internal Medicine

## 2023-12-15 NOTE — Telephone Encounter (Signed)
 Copied from CRM [id number]. Topic: Referral - Question >> Dec 15, 2023 11:30 AM Gennette ORN wrote: Reason for CRM: Patient is needing a referral to the hospitals for MRI from Dr. Norleen. He doesn't want to be in the capsule he can't see out of it he stated he has claustrophobia when it comes to things like this.

## 2023-12-15 NOTE — Telephone Encounter (Signed)
 Checked order for MRI and it looks like this might have been addressed already. They marked yes for claustrophobia in their notes.

## 2023-12-27 ENCOUNTER — Encounter: Payer: Self-pay | Admitting: Acute Care

## 2024-01-02 ENCOUNTER — Ambulatory Visit: Admitting: Orthopedic Surgery

## 2024-01-25 ENCOUNTER — Telehealth: Payer: Self-pay | Admitting: Radiology

## 2024-01-25 DIAGNOSIS — G8929 Other chronic pain: Secondary | ICD-10-CM

## 2024-01-25 NOTE — Telephone Encounter (Signed)
 Copied from CRM [id number]. Topic: Referral - Status >> Jan 25, 2024  2:16 PM Pinkey ORN wrote: Reason for CRM: Referral Status >> Jan 25, 2024  2:20 PM Pinkey ORN wrote: Patient states he's claustrophobic and has requested his referral be sent over to [facility] but instead it keeps being sent over to [facility] imaging. Patient is requesting to please have this referral sent over to [facility]. Please follow up with patient once completed.

## 2024-01-25 NOTE — Telephone Encounter (Signed)
 Copied from CRM [id number]. Topic: Clinical - Request for Lab/Test Order >> Jan 25, 2024  2:23 PM Pinkey ORN wrote: Reason for CRM: Lab Request >> Jan 25, 2024  2:24 PM Pinkey ORN wrote: Patient is requesting to have blood work completed before his physical.

## 2024-01-26 NOTE — Addendum Note (Signed)
 Addended by: NORLEEN LYNWOOD ORN on: 01/26/2024 02:28 PM   Modules accepted: Orders

## 2024-01-26 NOTE — Telephone Encounter (Signed)
 Can you change his MRI to be done at [facility] please. This patient can not go into the machine at [facility]

## 2024-01-26 NOTE — Telephone Encounter (Signed)
 Patient has been made aware.

## 2024-01-26 NOTE — Telephone Encounter (Signed)
 Ok this is done

## 2024-01-26 NOTE — Telephone Encounter (Signed)
 Patient has been made aware that Dr. Joshua doesn't place labs prior to appointment.

## 2024-01-29 ENCOUNTER — Encounter: Payer: Self-pay | Admitting: Internal Medicine

## 2024-01-29 NOTE — Progress Notes (Unsigned)
    Subjective:    Patient ID: Philip Kemp Raddle., male    DOB: 03-17-1966, 58 y.o.   MRN: [id number]      HPI Philip Zimmerman is here for No chief complaint on file.        Medications and allergies reviewed with patient and updated if appropriate.  Current Outpatient Medications on File Prior to Visit  Medication Sig Dispense Refill   naproxen  (NAPROSYN ) 500 MG tablet Take 1 tablet (500 mg total) by mouth 2 (two) times daily with a meal. 20 tablet 0   No current facility-administered medications on file prior to visit.    Review of Systems     Objective:  There were no vitals filed for this visit. BP Readings from Last 3 Encounters:  12/14/23 (!) 140/82  10/05/23 (!) 162/94  11/30/22 137/89   Wt Readings from Last 3 Encounters:  12/14/23 182 lb 9.6 oz (82.8 kg)  11/30/22 175 lb (79.4 kg)  10/24/22 175 lb (79.4 kg)   There is no height or weight on file to calculate BMI.    Physical Exam         Assessment & Plan:    See Problem List for Assessment and Plan of chronic medical problems.

## 2024-01-30 ENCOUNTER — Ambulatory Visit: Admitting: Internal Medicine

## 2024-01-30 VITALS — BP 142/92 | HR 110 | Temp 98.3°F | Ht 74.0 in | Wt 170.0 lb

## 2024-01-30 DIAGNOSIS — M5442 Lumbago with sciatica, left side: Secondary | ICD-10-CM | POA: Diagnosis not present

## 2024-01-30 DIAGNOSIS — R3915 Urgency of urination: Secondary | ICD-10-CM | POA: Diagnosis not present

## 2024-01-30 DIAGNOSIS — M5441 Lumbago with sciatica, right side: Secondary | ICD-10-CM | POA: Diagnosis not present

## 2024-01-30 DIAGNOSIS — G8929 Other chronic pain: Secondary | ICD-10-CM

## 2024-01-30 MED ORDER — GABAPENTIN 100 MG PO CAPS: 100.0000 mg | ORAL_CAPSULE | Freq: Every evening | ORAL | 0 refills | Status: DC | PRN

## 2024-01-30 MED ORDER — TIZANIDINE HCL 4 MG PO TABS: 4.0000 mg | ORAL_TABLET | Freq: Three times a day (TID) | ORAL | 2 refills | Status: AC | PRN

## 2024-01-30 NOTE — Patient Instructions (Addendum)
      Medications changes include :   tizanidine  4 mg, gabapentin  100-300 mg at bedtime    A referral was ordered [employer] and an open MRI and someone will call you to schedule an appointment.     Return if symptoms worsen or fail to improve.   [facility] Address: [address] [place], [place] [address] Phone: [phone]

## 2024-01-31 ENCOUNTER — Telehealth: Payer: Self-pay

## 2024-01-31 NOTE — Telephone Encounter (Signed)
 Copied from CRM [id number]. Topic: Referral - Status >> Jan 31, 2024  9:48 AM Turkey A wrote: Reason for CRM: Patient called to see check referral status from yesterday's visit-please contact

## 2024-02-02 NOTE — Telephone Encounter (Signed)
 Patient has been made aware that the referral has been placed he gave a verbal understanding.

## 2024-02-06 ENCOUNTER — Telehealth: Payer: Self-pay

## 2024-02-06 NOTE — Telephone Encounter (Signed)
 Copied from CRM #[id number]. Topic: Clinical - Request for Lab/Test Order >> Feb 06, 2024  2:58 PM Berneda FALCON wrote: Reason for CRM: Patient states that he has been called 3X from [facility] imaging and he cannot schedule with them that it has to be [facility] for an open MRI. I see the order in the chart from Dr. Geofm on 9/23 but [facility] is stating they still have not gotten the order and cannot schedule the patient without it. Patient is understandably upset given that he has been trying to get this done for over 3 weeks now and has had to call multiple times to try and make sure this was sent to the right place and that it was received so he can schedule.  Is there any way we could call them and do a verbal order or some way to get this order to them and confirm they received it so that he can schedule this appt please?  Patient callback is [phone]

## 2024-02-07 NOTE — Telephone Encounter (Signed)
 Copied from CRM #[id number]. Topic: Clinical - Request for Lab/Test Order >> Feb 06, 2024  2:58 PM Berneda FALCON wrote: Reason for CRM: Patient states that he has been called 3X from [facility] imaging and he cannot schedule with them that it has to be [facility] for an open MRI. I see the order in the chart from Dr. Geofm on 9/23 but [facility] is stating they still have not gotten the order and cannot schedule the patient without it. Patient is understandably upset given that he has been trying to get this done for over 3 weeks now and has had to call multiple times to try and make sure this was sent to the right place and that it was received so he can schedule.  Is there any way we could call them and do a verbal order or some way to get this order to them and confirm they received it so that he can schedule this appt please?  Patient callback is [phone] >> Feb 07, 2024  2:19 PM Franky GRADE wrote: Patient is calling to follow up on his request to send MRI order to [facility] .

## 2024-02-08 NOTE — Telephone Encounter (Signed)
 The orders has been faxed. The appointment for his MRI has been scheduled for 10/14 at 7pm. Patient has been made aware and gave a verbal understanding.

## 2024-02-25 ENCOUNTER — Other Ambulatory Visit: Payer: Self-pay | Admitting: Internal Medicine

## 2024-02-28 ENCOUNTER — Encounter: Payer: Self-pay | Admitting: Internal Medicine

## 2024-02-28 ENCOUNTER — Ambulatory Visit (INDEPENDENT_AMBULATORY_CARE_PROVIDER_SITE_OTHER): Admitting: Internal Medicine

## 2024-02-28 VITALS — BP 160/92 | HR 117 | Temp 98.4°F | Resp 16 | Ht 74.0 in | Wt 175.8 lb

## 2024-02-28 DIAGNOSIS — R000 Tachycardia, unspecified: Secondary | ICD-10-CM

## 2024-02-28 DIAGNOSIS — I10 Essential (primary) hypertension: Secondary | ICD-10-CM

## 2024-02-28 DIAGNOSIS — Z0000 Encounter for general adult medical examination without abnormal findings: Secondary | ICD-10-CM | POA: Diagnosis not present

## 2024-02-28 DIAGNOSIS — E785 Hyperlipidemia, unspecified: Secondary | ICD-10-CM

## 2024-02-28 DIAGNOSIS — R7989 Other specified abnormal findings of blood chemistry: Secondary | ICD-10-CM | POA: Diagnosis not present

## 2024-02-28 DIAGNOSIS — R0989 Other specified symptoms and signs involving the circulatory and respiratory systems: Secondary | ICD-10-CM

## 2024-02-28 DIAGNOSIS — R0609 Other forms of dyspnea: Secondary | ICD-10-CM

## 2024-02-28 DIAGNOSIS — E559 Vitamin D deficiency, unspecified: Secondary | ICD-10-CM

## 2024-02-28 DIAGNOSIS — Z0001 Encounter for general adult medical examination with abnormal findings: Secondary | ICD-10-CM | POA: Insufficient documentation

## 2024-02-28 DIAGNOSIS — K7010 Alcoholic hepatitis without ascites: Secondary | ICD-10-CM

## 2024-02-28 DIAGNOSIS — E781 Pure hyperglyceridemia: Secondary | ICD-10-CM

## 2024-02-28 DIAGNOSIS — G621 Alcoholic polyneuropathy: Secondary | ICD-10-CM

## 2024-02-28 DIAGNOSIS — G629 Polyneuropathy, unspecified: Secondary | ICD-10-CM | POA: Diagnosis not present

## 2024-02-28 DIAGNOSIS — R1084 Generalized abdominal pain: Secondary | ICD-10-CM

## 2024-02-28 LAB — TROPONIN I (HIGH SENSITIVITY): High Sens Troponin I: 3 ng/L (ref 2–17)

## 2024-02-28 LAB — CBC WITH DIFFERENTIAL/PLATELET
Basophils Absolute: 0.1 K/uL (ref 0.0–0.1)
Basophils Relative: 0.6 % (ref 0.0–3.0)
Eosinophils Absolute: 0.2 K/uL (ref 0.0–0.7)
Eosinophils Relative: 1.9 % (ref 0.0–5.0)
HCT: 39.8 % (ref 39.0–52.0)
Hemoglobin: 13.3 g/dL (ref 13.0–17.0)
Lymphocytes Relative: 18.8 % (ref 12.0–46.0)
Lymphs Abs: 1.6 K/uL (ref 0.7–4.0)
MCHC: 33.4 g/dL (ref 30.0–36.0)
MCV: 92 fl (ref 78.0–100.0)
Monocytes Absolute: 0.5 K/uL (ref 0.1–1.0)
Monocytes Relative: 6.4 % (ref 3.0–12.0)
Neutro Abs: 6.1 K/uL (ref 1.4–7.7)
Neutrophils Relative %: 72.3 % (ref 43.0–77.0)
Platelets: 183 K/uL (ref 150.0–400.0)
RBC: 4.32 Mil/uL (ref 4.22–5.81)
RDW: 14.6 % (ref 11.5–15.5)
WBC: 8.5 K/uL (ref 4.0–10.5)

## 2024-02-28 LAB — PROTIME-INR
INR: 1.1 ratio — ABNORMAL HIGH (ref 0.8–1.0)
Prothrombin Time: 11.7 s (ref 9.6–13.1)

## 2024-02-28 LAB — HEPATIC FUNCTION PANEL
ALT: 91 U/L — ABNORMAL HIGH (ref 0–53)
AST: 120 U/L — ABNORMAL HIGH (ref 0–37)
Albumin: 4.6 g/dL (ref 3.5–5.2)
Alkaline Phosphatase: 39 U/L (ref 39–117)
Bilirubin, Direct: 0.2 mg/dL (ref 0.0–0.3)
Total Bilirubin: 1.1 mg/dL (ref 0.2–1.2)
Total Protein: 7 g/dL (ref 6.0–8.3)

## 2024-02-28 LAB — BASIC METABOLIC PANEL WITH GFR
BUN: 8 mg/dL (ref 6–23)
CO2: 30 meq/L (ref 19–32)
Calcium: 9.4 mg/dL (ref 8.4–10.5)
Chloride: 100 meq/L (ref 96–112)
Creatinine, Ser: 0.99 mg/dL (ref 0.40–1.50)
GFR: 85.21 mL/min (ref >60.00)
Glucose, Bld: 106 mg/dL — ABNORMAL HIGH (ref 70–99)
Potassium: 3.8 meq/L (ref 3.5–5.1)
Sodium: 139 meq/L (ref 135–145)

## 2024-02-28 LAB — LIPID PANEL
Cholesterol: 206 mg/dL — ABNORMAL HIGH (ref 0–200)
HDL: 51.2 mg/dL (ref >39.00)
NonHDL: 154.85
Total CHOL/HDL Ratio: 4
Triglycerides: 460 mg/dL — ABNORMAL HIGH (ref 0.0–149.0)
VLDL: 92 mg/dL — ABNORMAL HIGH (ref 0.0–40.0)

## 2024-02-28 LAB — VITAMIN D 25 HYDROXY (VIT D DEFICIENCY, FRACTURES): VITD: 14.63 ng/mL — ABNORMAL LOW (ref 30.00–100.00)

## 2024-02-28 LAB — FOLATE: Folate: 12.2 ng/mL (ref >5.9)

## 2024-02-28 LAB — HEMOGLOBIN A1C: Hgb A1c MFr Bld: 5.8 % (ref 4.6–6.5)

## 2024-02-28 LAB — TSH: TSH: 1.78 u[IU]/mL (ref 0.35–5.50)

## 2024-02-28 LAB — VITAMIN B12: Vitamin B-12: 236 pg/mL (ref 211–911)

## 2024-02-28 LAB — LDL CHOLESTEROL, DIRECT: Direct LDL: 80 mg/dL

## 2024-02-28 LAB — PSA: PSA: 0.46 ng/mL (ref 0.10–4.00)

## 2024-02-28 NOTE — Patient Instructions (Signed)
 Health Maintenance, Male  Adopting a healthy lifestyle and getting preventive care are important in promoting health and wellness. Ask your health care provider about:  The right schedule for you to have regular tests and exams.  Things you can do on your own to prevent diseases and keep yourself healthy.  What should I know about diet, weight, and exercise?  Eat a healthy diet    Eat a diet that includes plenty of vegetables, fruits, low-fat dairy products, and lean protein.  Do not eat a lot of foods that are high in solid fats, added sugars, or sodium.  Maintain a healthy weight  Body mass index (BMI) is a measurement that can be used to identify possible weight problems. It estimates body fat based on height and weight. Your health care provider can help determine your BMI and help you achieve or maintain a healthy weight.  Get regular exercise  Get regular exercise. This is one of the most important things you can do for your health. Most adults should:  Exercise for at least 150 minutes each week. The exercise should increase your heart rate and make you sweat (moderate-intensity exercise).  Do strengthening exercises at least twice a week. This is in addition to the moderate-intensity exercise.  Spend less time sitting. Even light physical activity can be beneficial.  Watch cholesterol and blood lipids  Have your blood tested for lipids and cholesterol at 56 years of age, then have this test every 5 years.  You may need to have your cholesterol levels checked more often if:  Your lipid or cholesterol levels are high.  You are older than 56 years of age.  You are at high risk for heart disease.  What should I know about cancer screening?  Many types of cancers can be detected early and may often be prevented. Depending on your health history and family history, you may need to have cancer screening at various ages. This may include screening for:  Colorectal cancer.  Prostate cancer.  Skin cancer.  Lung  cancer.  What should I know about heart disease, diabetes, and high blood pressure?  Blood pressure and heart disease  High blood pressure causes heart disease and increases the risk of stroke. This is more likely to develop in people who have high blood pressure readings or are overweight.  Talk with your health care provider about your target blood pressure readings.  Have your blood pressure checked:  Every 3-5 years if you are 24-52 years of age.  Every year if you are 3 years old or older.  If you are between the ages of 60 and 72 and are a current or former smoker, ask your health care provider if you should have a one-time screening for abdominal aortic aneurysm (AAA).  Diabetes  Have regular diabetes screenings. This checks your fasting blood sugar level. Have the screening done:  Once every three years after age 66 if you are at a normal weight and have a low risk for diabetes.  More often and at a younger age if you are overweight or have a high risk for diabetes.  What should I know about preventing infection?  Hepatitis B  If you have a higher risk for hepatitis B, you should be screened for this virus. Talk with your health care provider to find out if you are at risk for hepatitis B infection.  Hepatitis C  Blood testing is recommended for:  Everyone born from [birth year] through 1963.  Anyone  with known risk factors for hepatitis C.  Sexually transmitted infections (STIs)  You should be screened each year for STIs, including gonorrhea and chlamydia, if:  You are sexually active and are younger than 56 years of age.  You are older than 56 years of age and your health care provider tells you that you are at risk for this type of infection.  Your sexual activity has changed since you were last screened, and you are at increased risk for chlamydia or gonorrhea. Ask your health care provider if you are at risk.  Ask your health care provider about whether you are at high risk for HIV. Your health care provider  may recommend a prescription medicine to help prevent HIV infection. If you choose to take medicine to prevent HIV, you should first get tested for HIV. You should then be tested every 3 months for as long as you are taking the medicine.  Follow these instructions at home:  Alcohol use  Do not drink alcohol if your health care provider tells you not to drink.  If you drink alcohol:  Limit how much you have to 0-2 drinks a day.  Know how much alcohol is in your drink. In the [place] one drink equals one 12 oz bottle of beer (355 mL), one 5 oz glass of wine (148 mL), or one 1 oz glass of hard liquor (44 mL).  Lifestyle  Do not use any products that contain nicotine or tobacco. These products include cigarettes, chewing tobacco, and vaping devices, such as e-cigarettes. If you need help quitting, ask your health care provider.  Do not use street drugs.  Do not share needles.  Ask your health care provider for help if you need support or information about quitting drugs.  General instructions  Schedule regular health, dental, and eye exams.  Stay current with your vaccines.  Tell your health care provider if:  You often feel depressed.  You have ever been abused or do not feel safe at home.  Summary  Adopting a healthy lifestyle and getting preventive care are important in promoting health and wellness.  Follow your health care provider's instructions about healthy diet, exercising, and getting tested or screened for diseases.  Follow your health care provider's instructions on monitoring your cholesterol and blood pressure.  This information is not intended to replace advice given to you by your health care provider. Make sure you discuss any questions you have with your health care provider.  Document Revised: 09/14/2020 Document Reviewed: 09/14/2020  Elsevier Patient Education  2024 [employer].

## 2024-02-28 NOTE — Progress Notes (Unsigned)
 Subjective:  Patient ID: Philip Zimmerman., male    DOB: February 06, 1966  Age: 58 y.o. MRN: [id number]  CC: Annual Exam, Hypertension, Back Pain, and Hyperlipidemia   HPI Philip Zimmerman. presents for a CPX and f/up --  Discussed the use of AI scribe software for clinical note transcription with the patient, who gave verbal consent to proceed.  History of Present Illness Philip Zimmerman. is a 58 year old male who presents with back pain and associated neurological symptoms.  He has been experiencing significant back issues since May 2025, with a gradual onset of symptoms. Initially, there was some improvement, but symptoms worsened after playing golf in July 2025. The pain radiates down both legs, with numbness and tingling in both feet. He also experiences a sensation of his socks not being fully on, indicating altered sensation in his feet. He has developed a 'drop foot' on the left side and reports difficulty lifting the leg.  He recalls an incident where he fell to the ground due to his legs buckling while taking off a T-shirt. He sought care at urgent care, where sciatica was initially suspected. He attempted physical therapy for a month but discontinued due to dissatisfaction with the sessions, which involved student participation. Physical activities, such as intercourse, exacerbate his symptoms, leaving him unable to walk the following day.  He has a history of smoking and consumes alcohol daily. He has not been vaccinated for flu or pneumonia recently, and his last COVID-19 vaccination was when it first became available. He has not been to the gym since July 2025 due to concerns about exacerbating his condition.  He takes gabapentin , which aids his sleep, although he only sleeps for about five and a half hours. He denies any significant changes in weight or appetite. He experiences abdominal tightness, particularly in the mornings, which sometimes leads to spasms radiating down  his legs. He reports urgency with urination but denies incontinence. No fever, chills, or respiratory symptoms. His last EKG was over a year ago, and he is unsure of any history of high blood pressure.     Outpatient Medications Prior to Visit  Medication Sig Dispense Refill   gabapentin  (NEURONTIN ) 100 MG capsule TAKE 1 TO 3 CAPSULES BY MOUTH AT BEDTIME AS NEEDED 90 capsule 0   tiZANidine  (ZANAFLEX ) 4 MG tablet Take 1 tablet (4 mg total) by mouth every 8 (eight) hours as needed for muscle spasms. 60 tablet 2   naproxen  (NAPROSYN ) 500 MG tablet Take 1 tablet (500 mg total) by mouth 2 (two) times daily with a meal. 20 tablet 0   No facility-administered medications prior to visit.    ROS Review of Systems  Gastrointestinal:  Positive for abdominal pain.  Musculoskeletal:  Positive for back pain.  Neurological:  Positive for numbness. Negative for weakness.  Hematological:  Negative for adenopathy. Does not bruise/bleed easily.  Psychiatric/Behavioral:  Positive for sleep disturbance.     Objective:  BP (!) 160/92 (BP Location: Left Arm, Patient Position: Sitting, Cuff Size: Normal) Comment: BP (R) 148/80  Pulse (!) 117   Temp 98.4 F (36.9 C) (Oral)   Resp 16   Ht 6' 2 (1.88 m)   Wt 175 lb 12.8 oz (79.7 kg)   SpO2 98%   BMI 22.57 kg/m   BP Readings from Last 3 Encounters:  02/28/24 (!) 160/92  01/30/24 (!) 142/92  12/14/23 (!) 140/82    Wt Readings from Last 3 Encounters:  02/28/24 175 lb 12.8 oz (  79.7 kg)  01/30/24 170 lb (77.1 kg)  12/14/23 182 lb 9.6 oz (82.8 kg)    Physical Exam Vitals reviewed.  Constitutional:      Appearance: Normal appearance.  HENT:     Mouth/Throat:     Mouth: Mucous membranes are moist.  Eyes:     General: No scleral icterus.    Conjunctiva/sclera: Conjunctivae normal.  Cardiovascular:     Rate and Rhythm: Regular rhythm. Tachycardia present.     Heart sounds: No murmur heard.    No friction rub. No gallop.     Comments:  EKG-- ST, 105 bpm No LVH, Q waves, or ST/T wave changes  Pulmonary:     Effort: Pulmonary effort is normal.     Breath sounds: No stridor. No wheezing, rhonchi or rales.  Abdominal:     General: Abdomen is flat. Bowel sounds are normal. There is no distension.     Palpations: There is no hepatomegaly, splenomegaly or mass.     Tenderness: There is no abdominal tenderness. There is no guarding.     Hernia: No hernia is present.  Musculoskeletal:        General: No swelling. Normal range of motion.     Cervical back: Neck supple.     Right lower leg: No edema.     Left lower leg: No edema.  Lymphadenopathy:     Cervical: No cervical adenopathy.  Skin:    General: Skin is warm and dry.  Neurological:     General: No focal deficit present.     Mental Status: He is alert. Mental status is at baseline.     Lab Results  Component Value Date   WBC 8.5 02/28/2024   HGB 13.3 02/28/2024   HCT 39.8 02/28/2024   PLT 183.0 02/28/2024   GLUCOSE 106 (H) 02/28/2024   CHOL 206 (H) 02/28/2024   TRIG (H) 02/28/2024    460.0 Triglyceride is over 400; calculations on Lipids are invalid.   HDL 51.20 02/28/2024   LDLDIRECT 80.0 02/28/2024   LDLCALC 67 08/21/2013   ALT 91 (H) 02/28/2024   AST 120 (H) 02/28/2024   NA 139 02/28/2024   K 3.8 02/28/2024   CL 100 02/28/2024   CREATININE 0.99 02/28/2024   BUN 8 02/28/2024   CO2 30 02/28/2024   TSH 1.78 02/28/2024   PSA 0.46 02/28/2024   INR 1.1 (H) 02/28/2024   HGBA1C 5.8 02/28/2024     Fibrosis 4 Score = 3.85 (High risk)        Interpretation for patients with NAFLD          <1.30       -  F0-F1 (Low risk)          1.30-2.67 -  Indeterminate           >2.67      -  F3-F4 (High risk)      Validated for ages 48-65         Assessment & Plan:  Tachycardia -     EKG 12-Lead -     TSH; Future -     CBC with Differential/Platelet; Future -     Troponin I (High Sensitivity); Future -     Nebivolol  HCl; Take 1 tablet (5 mg total) by  mouth daily.  Dispense: 90 tablet; Refill: 0  Elevated LFTs -     Hepatic function panel; Future -     Protime-INR; Future -     Hepatitis B surface antibody,quantitative;  Future  Essential hypertension -     Basic metabolic panel with GFR; Future -     Urinalysis, Routine w reflex microscopic; Future -     Aldosterone + renin activity w/ ratio; Future -     Nebivolol  HCl; Take 1 tablet (5 mg total) by mouth daily.  Dispense: 90 tablet; Refill: 0 -     AMB Referral VBCI Care Management  Hypertriglyceridemia  Encounter for general adult medical examination with abnormal findings -     Lipid panel; Future -     PSA; Future  Neuropathy -     Vitamin B12; Future -     Vitamin B1; Future -     Zinc; Future -     Hemoglobin A1c; Future -     CBC with Differential/Platelet; Future -     Folate; Future -     Methylmalonic acid, serum; Future  Vitamin D deficiency disease -     VITAMIN D 25 Hydroxy (Vit-D Deficiency, Fractures); Future -     Cholecalciferol ; Take 1 capsule (50,000 Units total) by mouth once a week.  Dispense: 12 capsule; Refill: 1  DOE (dyspnea on exertion) -     Troponin I (High Sensitivity); Future -     D-dimer, quantitative; Future -     CT CORONARY MORPH W/CTA COR W/SCORE W/CA W/CM &/OR WO/CM; Future  Unequal blood pressure in upper extremities -     CT CORONARY MORPH W/CTA COR W/SCORE W/CA W/CM &/OR WO/CM; Future  Generalized abdominal pain -     Lipase; Future  Dyslipidemia, goal LDL below 70 -     Rosuvastatin  Calcium ; Take 1 tablet (10 mg total) by mouth daily.  Dispense: 90 tablet; Refill: 0 -     AMB Referral VBCI Care Management  Alcohol-induced polyneuropathy -     Vitamin B-1; Take 1 tablet (50 mg total) by mouth daily.  Dispense: 90 tablet; Refill: 1 -     AMB Referral VBCI Care Management  Alcoholic hepatitis without ascites (HCC) -     Vitamin B-1; Take 1 tablet (50 mg total) by mouth daily.  Dispense: 90 tablet; Refill: 1 -     AMB  Referral VBCI Care Management  Other orders -     LDL cholesterol, direct     Follow-up: Return in about 3 months (around 05/30/2024).  Debby Molt, MD

## 2024-02-29 ENCOUNTER — Ambulatory Visit (INDEPENDENT_AMBULATORY_CARE_PROVIDER_SITE_OTHER)

## 2024-02-29 ENCOUNTER — Ambulatory Visit: Payer: Self-pay | Admitting: Internal Medicine

## 2024-02-29 DIAGNOSIS — K7010 Alcoholic hepatitis without ascites: Secondary | ICD-10-CM | POA: Insufficient documentation

## 2024-02-29 DIAGNOSIS — R1084 Generalized abdominal pain: Secondary | ICD-10-CM

## 2024-02-29 DIAGNOSIS — E785 Hyperlipidemia, unspecified: Secondary | ICD-10-CM | POA: Insufficient documentation

## 2024-02-29 DIAGNOSIS — R0989 Other specified symptoms and signs involving the circulatory and respiratory systems: Secondary | ICD-10-CM | POA: Insufficient documentation

## 2024-02-29 LAB — URINALYSIS, ROUTINE W REFLEX MICROSCOPIC
Bilirubin Urine: NEGATIVE
Hgb urine dipstick: NEGATIVE
Ketones, ur: NEGATIVE
Leukocytes,Ua: NEGATIVE
Nitrite: NEGATIVE
RBC / HPF: NONE SEEN (ref 0–?)
Specific Gravity, Urine: >=1.03 — AB (ref 1.000–1.030)
Urine Glucose: NEGATIVE
Urobilinogen, UA: 0.2 (ref 0.0–1.0)
pH: 5.5 (ref 5.0–8.0)

## 2024-02-29 LAB — LIPASE: Lipase: 18 U/L (ref 11.0–59.0)

## 2024-02-29 MED ORDER — CHOLECALCIFEROL 1.25 MG (50000 UT) PO CAPS: 50000.0000 [IU] | ORAL_CAPSULE | ORAL | 1 refills | Status: AC

## 2024-02-29 MED ORDER — NEBIVOLOL HCL 5 MG PO TABS: 5.0000 mg | ORAL_TABLET | Freq: Every day | ORAL | 0 refills | Status: AC

## 2024-02-29 MED ORDER — ROSUVASTATIN CALCIUM 10 MG PO TABS: 10.0000 mg | ORAL_TABLET | Freq: Every day | ORAL | 0 refills | Status: AC

## 2024-02-29 NOTE — Telephone Encounter (Signed)
 Copied from CRM #[id number]. Topic: Clinical - Lab/Test Results >> Feb 29, 2024 12:00 PM Mia F wrote: Reason for CRM: Pt is calling to find out why he is scheduled to have a CT done. He asks to be called.

## 2024-02-29 NOTE — Telephone Encounter (Signed)
 Copied from CRM [id number]. Topic: Clinical - Lab/Test Results >> Feb 29, 2024 12:56 PM Jasmin G wrote: Reason for CRM: Pt requested a call back from Dr. Joshua team to discuss recent message from him regarding the order for a CT scan. Call pt back at [phone].

## 2024-03-02 MED ORDER — VITAMIN B-1 50 MG PO TABS: 50.0000 mg | ORAL_TABLET | Freq: Every day | ORAL | 1 refills | Status: AC

## 2024-03-05 ENCOUNTER — Telehealth: Payer: Self-pay | Admitting: *Deleted

## 2024-03-05 NOTE — Progress Notes (Unsigned)
 Complex Care Management Note Care Guide Note  03/05/2024 Name: Philip Zimmerman. MRN: [id number] DOB: [birth year]   Complex Care Management Outreach Attempts: An unsuccessful telephone outreach was attempted today to offer the patient information about available complex care management services.  Follow Up Plan:  Additional outreach attempts will be made to offer the patient complex care management information and services.   Encounter Outcome:  No Answer  Thedford Franks, CMA [place]  [facility], [facility] Guide Direct Dial: [phone]  Fax: [phone] Website: [email or web address]

## 2024-03-06 ENCOUNTER — Other Ambulatory Visit: Payer: Self-pay | Admitting: Internal Medicine

## 2024-03-06 ENCOUNTER — Telehealth (HOSPITAL_COMMUNITY): Payer: Self-pay | Admitting: *Deleted

## 2024-03-06 ENCOUNTER — Ambulatory Visit: Payer: Self-pay | Admitting: Internal Medicine

## 2024-03-06 DIAGNOSIS — M48061 Spinal stenosis, lumbar region without neurogenic claudication: Secondary | ICD-10-CM | POA: Insufficient documentation

## 2024-03-06 NOTE — Progress Notes (Signed)
 Complex Care Management Note  Care Guide Note 03/06/2024 Name: Philip Zimmerman. MRN: [id number] DOB: 1965/12/29  Philip Zimmerman. is a 58 y.o. year old male who sees Joshua Debby CROME, MD for primary care. I reached out to Philip Zimmerman. by phone today to offer complex care management services.  [name] was given information about Complex Care Management services today including:   The Complex Care Management services include support from the care team which includes your Nurse Care Manager, Clinical Social Worker, or Pharmacist.  The Complex Care Management team is here to help remove barriers to the health concerns and goals most important to you. Complex Care Management services are voluntary, and the patient may decline or stop services at any time by request to their care team member.   Complex Care Management Consent Status: Patient agreed to services and verbal consent obtained.   Follow up plan:  Telephone appointment with complex care management team member scheduled for:  03/14/2024 and 03/29/2024  Encounter Outcome:  Patient Scheduled  Thedford Franks, CMA [place]  [facility], [facility] Guide Direct Dial: [phone]  Fax: [phone] Website: [email or web address]

## 2024-03-06 NOTE — Telephone Encounter (Signed)
 Attempted to call patient regarding upcoming cardiac CT appointment. Left message on voicemail with name and callback number  Larey Brick RN Navigator Cardiac Imaging [facility] [phone] Office [phone] Cell

## 2024-03-07 ENCOUNTER — Ambulatory Visit (HOSPITAL_COMMUNITY)
Admission: RE | Admit: 2024-03-07 | Discharge: 2024-03-07 | Disposition: A | Discharge status: Home / Self Care | Source: Ambulatory Visit | Attending: Internal Medicine | Admitting: Internal Medicine

## 2024-03-07 DIAGNOSIS — I2510 Atherosclerotic heart disease of native coronary artery without angina pectoris: Secondary | ICD-10-CM

## 2024-03-07 DIAGNOSIS — R0609 Other forms of dyspnea: Secondary | ICD-10-CM | POA: Diagnosis present

## 2024-03-07 DIAGNOSIS — R931 Abnormal findings on diagnostic imaging of heart and coronary circulation: Secondary | ICD-10-CM | POA: Insufficient documentation

## 2024-03-07 DIAGNOSIS — R0989 Other specified symptoms and signs involving the circulatory and respiratory systems: Secondary | ICD-10-CM | POA: Insufficient documentation

## 2024-03-07 MED ORDER — NITROGLYCERIN 0.4 MG SL SUBL
0.8000 mg | SUBLINGUAL_TABLET | Freq: Once | SUBLINGUAL | Status: AC
  Administered 2024-03-07: 0.8 mg via SUBLINGUAL

## 2024-03-07 MED ORDER — DILTIAZEM HCL 25 MG/5ML IV SOLN: 10.0000 mg | INTRAVENOUS | Status: DC | PRN

## 2024-03-07 MED ORDER — IOHEXOL 350 MG/ML SOLN
95.0000 mL | Freq: Once | INTRAVENOUS | Status: AC | PRN
Start: 2024-03-07 — End: 2024-03-07
  Administered 2024-03-07: 95 mL via INTRAVENOUS

## 2024-03-07 MED ORDER — METOPROLOL TARTRATE 5 MG/5ML IV SOLN
10.0000 mg | INTRAVENOUS | Status: DC | PRN
  Administered 2024-03-07: 10 mg via INTRAVENOUS

## 2024-03-08 ENCOUNTER — Other Ambulatory Visit: Payer: Self-pay | Admitting: Cardiology

## 2024-03-08 ENCOUNTER — Ambulatory Visit (HOSPITAL_BASED_OUTPATIENT_CLINIC_OR_DEPARTMENT_OTHER)
Admission: RE | Admit: 2024-03-08 | Discharge: 2024-03-08 | Disposition: A | Discharge status: Home / Self Care | Source: Ambulatory Visit | Attending: Cardiology | Admitting: Cardiology

## 2024-03-08 DIAGNOSIS — R931 Abnormal findings on diagnostic imaging of heart and coronary circulation: Secondary | ICD-10-CM

## 2024-03-11 ENCOUNTER — Encounter: Payer: Self-pay | Admitting: Radiology

## 2024-03-11 ENCOUNTER — Other Ambulatory Visit: Payer: Self-pay | Admitting: Internal Medicine

## 2024-03-11 DIAGNOSIS — I2510 Atherosclerotic heart disease of native coronary artery without angina pectoris: Secondary | ICD-10-CM

## 2024-03-11 MED ORDER — ASPIRIN 81 MG PO TBEC: 81.0000 mg | DELAYED_RELEASE_TABLET | Freq: Every day | ORAL | 0 refills | Status: AC

## 2024-03-14 ENCOUNTER — Other Ambulatory Visit: Admitting: Pharmacist

## 2024-03-14 DIAGNOSIS — E785 Hyperlipidemia, unspecified: Secondary | ICD-10-CM

## 2024-03-14 DIAGNOSIS — I10 Essential (primary) hypertension: Secondary | ICD-10-CM

## 2024-03-14 NOTE — Progress Notes (Signed)
 03/14/2024 Name: [name]. MRN: [id number] DOB: 10-15-1965  Chief Complaint  Patient presents with   Hypertension   Hyperlipidemia   Medication Management    Quitman Norberto. is a 58 y.o. year old male who presented for a telephone visit.   They were referred to the pharmacist by their PCP for assistance in managing hypertension and hyperlipidemia/cardiovascular risk reduction.    Subjective:  Care Team: Primary Care Provider: Joshua Debby CROME, MD ; Next Scheduled Visit: not scheduled   Medication Access/Adherence  Current Pharmacy:  [facility] [address], [place] [address] [place] [address] Phone: [phone] Fax: [phone]  [id number] [place], [place] - [address] AT [facility] OF [facility] & [address] [place] [address] Phone: [phone] Fax: [phone]   Patient reports affordability concerns with their medications: No  Patient reports access/transportation concerns to their pharmacy: No  Patient reports adherence concerns with their medications:  No    Pt notes he started all the medications PCP sent to the pharmacy without issue.   Hypertension:  Current medications: nebivolol  5 mg daily (also for tachycardia) Medications previously tried: none  Patient does not have a validated, automated, upper arm home BP cuff Current blood pressure readings: n/a  Patient denies hypotensive s/sx including dizziness, lightheadedness.  Patient denies hypertensive symptoms including headache, chest pain, shortness of breath   Hyperlipidemia/ASCVD Risk Reduction  Current lipid lowering medications: rosuvastatin  10 mg daily Medications tried in the past: fenofibrate , Vascepa , Lovaza   Antiplatelet regimen: aspirin 81 mg daily   02/26/24 CAC score 562, 98th percentile.   PREVENT Risk Score:   [email or web address] - 10 year risk of CVD: 7.9% - 10 year risk of ASCVD: 5.2% - 10 year risk of HF: 3.8%   Objective:  Lab Results  Component Value Date   HGBA1C 5.8 02/28/2024    Lab Results  Component Value Date   CREATININE 0.99 02/28/2024   BUN 8 02/28/2024   NA 139 02/28/2024   K 3.8 02/28/2024   CL 100 02/28/2024   CO2 30 02/28/2024    Lab Results  Component Value Date   CHOL 206 (H) 02/28/2024   HDL 51.20 02/28/2024   LDLCALC 67 08/21/2013   LDLDIRECT 80.0 02/28/2024   TRIG (H) 02/28/2024    460.0 Triglyceride is over 400; calculations on Lipids are invalid.   CHOLHDL 4 02/28/2024    Medications Reviewed Today     Reviewed by Merceda Lela SAUNDERS, [facility] (Pharmacist) on 03/14/24 at 1129  Med List Status: <None>   Medication Order Taking? Sig Documenting Provider Last Dose Status Informant  aspirin EC 81 MG tablet [id number] Yes Take 1 tablet (81 mg total) by mouth daily. Joshua Debby CROME, MD  Active   Cholecalciferol  1.25 MG (50000 UT) capsule [id number] Yes Take 1 capsule (50,000 Units total) by mouth once a week. Joshua Debby CROME, MD  Active   gabapentin  (NEURONTIN ) 100 MG capsule [id number] Yes TAKE 1 TO 3 CAPSULES BY MOUTH AT BEDTIME AS NEEDED Burns, Glade PARAS, MD  Active   nebivolol  (BYSTOLIC ) 5 MG tablet [id number] Yes Take 1 tablet (5 mg total) by mouth daily. Joshua Debby CROME, MD  Active   rosuvastatin  (CRESTOR ) 10 MG tablet [id number] Yes Take 1 tablet (10 mg total) by mouth daily. Joshua Debby CROME, MD  Active   thiamine (VITAMIN B-1) 50 MG tablet [id number] Yes Take 1 tablet (50  mg total) by mouth daily. Joshua Debby CROME, MD  Active   tiZANidine  (ZANAFLEX ) 4 MG tablet [id number] Yes Take 1 tablet (4 mg total) by mouth every 8 (eight) hours as needed for muscle spasms. Geofm Glade PARAS, MD  Active               Assessment/Plan:   Hypertension: - Currently uncontrolled, BP goal  <130/80.  - Reviewed long term cardiovascular and renal outcomes of uncontrolled blood pressure - Reviewed appropriate blood pressure monitoring technique and reviewed goal blood pressure. Recommended to purchase home BP monitor and check home blood pressure and heart rate  - Recommend to continue current regimen. Contact me if BP is elevated at home. - Consider ARB if BP elevated given CAD     Hyperlipidemia/ASCVD Risk Reduction: - Currently uncontrolled. LDL goal <70 - Reviewed long term complications of uncontrolled cholesterol - Recommend to continue rosuvastatin  and aspirin  - Reviewed cardiac CT results  Scheduled 3 month PCP follow up  Follow Up Plan: PCP f/u 1/22  Darrelyn Drum, PharmD, BCPS, CPP Clinical Pharmacist Practitioner [place] [facility] [phone]

## 2024-03-14 NOTE — Patient Instructions (Signed)
 It was a pleasure speaking with you today!  Continue your current medications.  Check blood pressure and heart rate at home. Contact me if you are seeing blood pressure readings often above 130/80 or heart rate above 95.  Feel free to call with any questions or concerns!  Darrelyn Drum, PharmD, BCPS, CPP Clinical Pharmacist Practitioner [place] [facility] at [facility] [phone]

## 2024-03-17 LAB — METHYLMALONIC ACID, SERUM: Methylmalonic Acid, Quant: 77 nmol/L (ref 55–335)

## 2024-03-17 LAB — D-DIMER, QUANTITATIVE: D-Dimer, Quant: 0.24 ug{FEU}/mL (ref <0.50)

## 2024-03-17 LAB — ALDOSTERONE + RENIN ACTIVITY W/ RATIO
ALDO / PRA Ratio: 0.5 ratio — ABNORMAL LOW (ref 0.9–28.9)
Aldosterone: 2 ng/dL
Renin Activity: 3.71 ng/mL/h (ref 0.25–5.82)

## 2024-03-17 LAB — HEPATITIS B SURFACE ANTIBODY, QUANTITATIVE: Hep B S AB Quant (Post): 926 m[IU]/mL (ref >=10)

## 2024-03-17 LAB — VITAMIN B1: Vitamin B1 (Thiamine): 8 nmol/L (ref 8–30)

## 2024-03-17 LAB — ZINC: Zinc: 63 ug/dL (ref 60–130)

## 2024-03-18 ENCOUNTER — Other Ambulatory Visit: Payer: Self-pay | Admitting: Internal Medicine

## 2024-03-18 DIAGNOSIS — I10 Essential (primary) hypertension: Secondary | ICD-10-CM

## 2024-03-29 ENCOUNTER — Telehealth: Payer: Self-pay | Admitting: *Deleted

## 2024-03-29 ENCOUNTER — Encounter: Payer: Self-pay | Admitting: *Deleted

## 2024-05-07 ENCOUNTER — Telehealth: Payer: Self-pay

## 2024-05-07 NOTE — Progress Notes (Signed)
 Complex Care Management Care Guide Note  05/07/2024 Name: Philip Zimmerman. MRN: [id number] DOB: 01-03-1966  [name]. is a 58 y.o. year old male who is a primary care patient of Joshua Debby CROME, MD and is actively engaged with the care management team. I reached out to [name]. by phone today to assist with re-scheduling  with the Licensed Clinical Social Worker.  Follow up plan: Unsuccessful telephone outreach attempt made. A HIPAA compliant phone message was left for the patient providing contact information and requesting a return call.  Jeoffrey Buffalo , RMA     [facility]  [facility], [facility] Guide  Direct Dial: [phone]  Website: [email or web address]

## 2024-05-22 NOTE — Progress Notes (Signed)
 Complex Care Management Care Guide Note  05/22/2024 Name: Philip Zimmerman. MRN: [id number] DOB: [birth year]  Philip Zimmerman. is a 59 y.o. year old male who is a primary care patient of Joshua Debby CROME, MD and is actively engaged with the care management team. I reached out to Philip Zimmerman. by phone today to assist with re-scheduling  with the Licensed Clinical Social Worker.  Follow up plan: Unsuccessful telephone outreach attempt made. A HIPAA compliant phone message was left for the patient providing contact information and requesting a return call.  Jeoffrey Buffalo , RMA     [facility]  [facility], [facility] Guide  Direct Dial: [phone]  Website: [email or web address]

## 2024-05-27 ENCOUNTER — Telehealth: Payer: Self-pay

## 2024-05-27 NOTE — Progress Notes (Signed)
 Complex Care Management Care Guide Note  05/27/2024 Name: Philip Zimmerman. MRN: [id number] DOB: [birth year]  Philip Kemp Raddle. is a 59 y.o. year old male who is a primary care patient of Joshua Debby CROME, MD and is actively engaged with the care management team. I reached out to Philip Kemp Raddle. by phone today to assist with re-scheduling  with the Licensed Clinical Social Worker.  Follow up plan: Unsuccessful telephone outreach attempt made. A HIPAA compliant phone message was left for the patient providing contact information and requesting a return call.  Jeoffrey Buffalo , RMA     [facility]  [facility], [facility] Guide  Direct Dial: [phone]  Website: [email or web address]

## 2024-05-30 ENCOUNTER — Ambulatory Visit: Admitting: Internal Medicine

## 2024-06-06 NOTE — Progress Notes (Signed)
 Complex Care Management Care Guide Note  06/06/2024 Name: Philip Zimmerman. MRN: [id number] DOB: January 21, 1966  [name]. is a 59 y.o. year old male who is a primary care patient of Joshua Debby CROME, MD and is actively engaged with the care management team. I reached out to [name]. by phone today to assist with re-scheduling  with the Licensed Clinical Social Worker.  Follow up plan: Unsuccessful telephone outreach attempt made. A HIPAA compliant phone message was left for the patient providing contact information and requesting a return call.  Jeoffrey Buffalo , RMA     [facility]  [facility], [facility] Guide  Direct Dial: [phone]  Website: [email or web address]
